# Patient Record
Sex: Female | Born: 1935 | Race: White | Hispanic: No
Health system: Southern US, Community
[De-identification: ages and names within clinical notes are randomized; demographics above are authoritative.]

## PROBLEM LIST (undated history)

## (undated) DIAGNOSIS — I4821 Permanent atrial fibrillation: Secondary | ICD-10-CM

---

## 2015-09-05 ENCOUNTER — Encounter (HOSPITAL_COMMUNITY): Payer: Self-pay | Admitting: Physician Assistant

## 2015-09-05 ENCOUNTER — Inpatient Hospital Stay (HOSPITAL_COMMUNITY): Payer: Medicare Other

## 2015-09-05 ENCOUNTER — Inpatient Hospital Stay (HOSPITAL_COMMUNITY)
Admission: AD | Admit: 2015-09-05 | Discharge: 2015-10-03 | DRG: 870 | Disposition: E | Payer: Medicare Other | Source: Other Acute Inpatient Hospital | Attending: Pulmonary Disease | Admitting: Pulmonary Disease

## 2015-09-05 DIAGNOSIS — Z7901 Long term (current) use of anticoagulants: Secondary | ICD-10-CM | POA: Diagnosis not present

## 2015-09-05 DIAGNOSIS — F419 Anxiety disorder, unspecified: Secondary | ICD-10-CM | POA: Diagnosis not present

## 2015-09-05 DIAGNOSIS — N170 Acute kidney failure with tubular necrosis: Secondary | ICD-10-CM | POA: Diagnosis present

## 2015-09-05 DIAGNOSIS — T82594A Other mechanical complication of infusion catheter, initial encounter: Secondary | ICD-10-CM

## 2015-09-05 DIAGNOSIS — E871 Hypo-osmolality and hyponatremia: Secondary | ICD-10-CM | POA: Diagnosis not present

## 2015-09-05 DIAGNOSIS — J9602 Acute respiratory failure with hypercapnia: Secondary | ICD-10-CM | POA: Diagnosis present

## 2015-09-05 DIAGNOSIS — I429 Cardiomyopathy, unspecified: Secondary | ICD-10-CM | POA: Diagnosis present

## 2015-09-05 DIAGNOSIS — E872 Acidosis, unspecified: Secondary | ICD-10-CM | POA: Diagnosis present

## 2015-09-05 DIAGNOSIS — I5023 Acute on chronic systolic (congestive) heart failure: Secondary | ICD-10-CM | POA: Diagnosis present

## 2015-09-05 DIAGNOSIS — R6521 Severe sepsis with septic shock: Secondary | ICD-10-CM | POA: Diagnosis present

## 2015-09-05 DIAGNOSIS — A419 Sepsis, unspecified organism: Principal | ICD-10-CM | POA: Diagnosis present

## 2015-09-05 DIAGNOSIS — N179 Acute kidney failure, unspecified: Secondary | ICD-10-CM | POA: Diagnosis not present

## 2015-09-05 DIAGNOSIS — I4892 Unspecified atrial flutter: Secondary | ICD-10-CM | POA: Diagnosis not present

## 2015-09-05 DIAGNOSIS — I5043 Acute on chronic combined systolic (congestive) and diastolic (congestive) heart failure: Secondary | ICD-10-CM | POA: Diagnosis present

## 2015-09-05 DIAGNOSIS — J9601 Acute respiratory failure with hypoxia: Secondary | ICD-10-CM | POA: Diagnosis present

## 2015-09-05 DIAGNOSIS — Y95 Nosocomial condition: Secondary | ICD-10-CM | POA: Diagnosis not present

## 2015-09-05 DIAGNOSIS — Z9119 Patient's noncompliance with other medical treatment and regimen: Secondary | ICD-10-CM | POA: Diagnosis not present

## 2015-09-05 DIAGNOSIS — I2489 Other forms of acute ischemic heart disease: Secondary | ICD-10-CM | POA: Diagnosis present

## 2015-09-05 DIAGNOSIS — Z515 Encounter for palliative care: Secondary | ICD-10-CM | POA: Insufficient documentation

## 2015-09-05 DIAGNOSIS — Z87891 Personal history of nicotine dependence: Secondary | ICD-10-CM | POA: Diagnosis not present

## 2015-09-05 DIAGNOSIS — R001 Bradycardia, unspecified: Secondary | ICD-10-CM | POA: Diagnosis not present

## 2015-09-05 DIAGNOSIS — R0603 Acute respiratory distress: Secondary | ICD-10-CM

## 2015-09-05 DIAGNOSIS — I214 Non-ST elevation (NSTEMI) myocardial infarction: Secondary | ICD-10-CM | POA: Diagnosis not present

## 2015-09-05 DIAGNOSIS — J9690 Respiratory failure, unspecified, unspecified whether with hypoxia or hypercapnia: Secondary | ICD-10-CM

## 2015-09-05 DIAGNOSIS — I130 Hypertensive heart and chronic kidney disease with heart failure and stage 1 through stage 4 chronic kidney disease, or unspecified chronic kidney disease: Secondary | ICD-10-CM | POA: Diagnosis present

## 2015-09-05 DIAGNOSIS — I340 Nonrheumatic mitral (valve) insufficiency: Secondary | ICD-10-CM

## 2015-09-05 DIAGNOSIS — R570 Cardiogenic shock: Secondary | ICD-10-CM | POA: Diagnosis present

## 2015-09-05 DIAGNOSIS — I4891 Unspecified atrial fibrillation: Secondary | ICD-10-CM

## 2015-09-05 DIAGNOSIS — D649 Anemia, unspecified: Secondary | ICD-10-CM | POA: Diagnosis present

## 2015-09-05 DIAGNOSIS — J9600 Acute respiratory failure, unspecified whether with hypoxia or hypercapnia: Secondary | ICD-10-CM | POA: Diagnosis not present

## 2015-09-05 DIAGNOSIS — G9340 Encephalopathy, unspecified: Secondary | ICD-10-CM | POA: Diagnosis not present

## 2015-09-05 DIAGNOSIS — R000 Tachycardia, unspecified: Secondary | ICD-10-CM | POA: Diagnosis not present

## 2015-09-05 DIAGNOSIS — K7200 Acute and subacute hepatic failure without coma: Secondary | ICD-10-CM | POA: Diagnosis present

## 2015-09-05 DIAGNOSIS — I350 Nonrheumatic aortic (valve) stenosis: Secondary | ICD-10-CM | POA: Diagnosis present

## 2015-09-05 DIAGNOSIS — Z452 Encounter for adjustment and management of vascular access device: Secondary | ICD-10-CM

## 2015-09-05 DIAGNOSIS — I483 Typical atrial flutter: Secondary | ICD-10-CM | POA: Diagnosis not present

## 2015-09-05 DIAGNOSIS — Z66 Do not resuscitate: Secondary | ICD-10-CM | POA: Diagnosis not present

## 2015-09-05 DIAGNOSIS — E876 Hypokalemia: Secondary | ICD-10-CM | POA: Diagnosis not present

## 2015-09-05 DIAGNOSIS — R739 Hyperglycemia, unspecified: Secondary | ICD-10-CM | POA: Diagnosis not present

## 2015-09-05 DIAGNOSIS — Z01818 Encounter for other preprocedural examination: Secondary | ICD-10-CM

## 2015-09-05 DIAGNOSIS — R34 Anuria and oliguria: Secondary | ICD-10-CM | POA: Diagnosis not present

## 2015-09-05 DIAGNOSIS — R0600 Dyspnea, unspecified: Secondary | ICD-10-CM

## 2015-09-05 DIAGNOSIS — J189 Pneumonia, unspecified organism: Secondary | ICD-10-CM | POA: Diagnosis not present

## 2015-09-05 DIAGNOSIS — I248 Other forms of acute ischemic heart disease: Secondary | ICD-10-CM | POA: Diagnosis present

## 2015-09-05 DIAGNOSIS — Z9114 Patient's other noncompliance with medication regimen: Secondary | ICD-10-CM | POA: Diagnosis not present

## 2015-09-05 DIAGNOSIS — N185 Chronic kidney disease, stage 5: Secondary | ICD-10-CM | POA: Diagnosis present

## 2015-09-05 DIAGNOSIS — E8729 Other acidosis: Secondary | ICD-10-CM | POA: Diagnosis present

## 2015-09-05 DIAGNOSIS — Z882 Allergy status to sulfonamides status: Secondary | ICD-10-CM | POA: Diagnosis not present

## 2015-09-05 DIAGNOSIS — I482 Chronic atrial fibrillation: Secondary | ICD-10-CM | POA: Diagnosis present

## 2015-09-05 DIAGNOSIS — I10 Essential (primary) hypertension: Secondary | ICD-10-CM

## 2015-09-05 DIAGNOSIS — K759 Inflammatory liver disease, unspecified: Secondary | ICD-10-CM | POA: Diagnosis not present

## 2015-09-05 HISTORY — DX: Permanent atrial fibrillation: I48.21

## 2015-09-05 LAB — STREP PNEUMONIAE URINARY ANTIGEN: STREP PNEUMO URINARY ANTIGEN: NEGATIVE

## 2015-09-05 LAB — BLOOD GAS, ARTERIAL
ACID-BASE DEFICIT: 14.8 mmol/L — AB (ref 0.0–2.0)
Bicarbonate: 12.5 mEq/L — ABNORMAL LOW (ref 20.0–24.0)
Drawn by: 44898
FIO2: 0.6
LHR: 16 {breaths}/min
O2 SAT: 94.8 %
PCO2 ART: 38.6 mmHg (ref 35.0–45.0)
PEEP/CPAP: 10 cmH2O
PH ART: 7.136 — AB (ref 7.350–7.450)
Patient temperature: 98.6
TCO2: 13.6 mmol/L (ref 0–100)
VT: 420 mL
pO2, Arterial: 96.1 mmHg (ref 80.0–100.0)

## 2015-09-05 LAB — CBC WITH DIFFERENTIAL/PLATELET
BASOS ABS: 0 10*3/uL (ref 0.0–0.1)
Basophils Relative: 0 %
EOS ABS: 0 10*3/uL (ref 0.0–0.7)
EOS PCT: 0 %
HCT: 42.6 % (ref 36.0–46.0)
Hemoglobin: 13.3 g/dL (ref 12.0–15.0)
Lymphocytes Relative: 21 %
Lymphs Abs: 3.1 10*3/uL (ref 0.7–4.0)
MCH: 29.4 pg (ref 26.0–34.0)
MCHC: 31.2 g/dL (ref 30.0–36.0)
MCV: 94 fL (ref 78.0–100.0)
MONOS PCT: 9 %
Monocytes Absolute: 1.3 10*3/uL — ABNORMAL HIGH (ref 0.1–1.0)
NEUTROS PCT: 70 %
Neutro Abs: 10.2 10*3/uL — ABNORMAL HIGH (ref 1.7–7.7)
PLATELETS: 237 10*3/uL (ref 150–400)
RBC: 4.53 MIL/uL (ref 3.87–5.11)
RDW: 15 % (ref 11.5–15.5)
WBC: 14.6 10*3/uL — AB (ref 4.0–10.5)

## 2015-09-05 LAB — POCT I-STAT 3, ART BLOOD GAS (G3+)
Acid-base deficit: 12 mmol/L — ABNORMAL HIGH (ref 0.0–2.0)
Acid-base deficit: 13 mmol/L — ABNORMAL HIGH (ref 0.0–2.0)
Bicarbonate: 17.5 mEq/L — ABNORMAL LOW (ref 20.0–24.0)
Bicarbonate: 13.1 mEq/L — ABNORMAL LOW (ref 20.0–24.0)
O2 Saturation: 86 %
O2 Saturation: 92 %
PCO2 ART: 53.7 mmHg — AB (ref 35.0–45.0)
PCO2 ART: 29.3 mmHg — AB (ref 35.0–45.0)
PH ART: 7.117 — AB (ref 7.350–7.450)
PH ART: 7.252 — AB (ref 7.350–7.450)
PO2 ART: 66 mmHg — AB (ref 80.0–100.0)
PO2 ART: 66 mmHg — AB (ref 80.0–100.0)
Patient temperature: 97.4
Patient temperature: 96.1
TCO2: 19 mmol/L (ref 0–100)
TCO2: 14 mmol/L (ref 0–100)

## 2015-09-05 LAB — URINE MICROSCOPIC-ADD ON

## 2015-09-05 LAB — ECHOCARDIOGRAM COMPLETE: Weight: 2673.74 oz

## 2015-09-05 LAB — COMPREHENSIVE METABOLIC PANEL
ALBUMIN: 2.3 g/dL — AB (ref 3.5–5.0)
ALK PHOS: 61 U/L (ref 38–126)
ALT: 288 U/L — ABNORMAL HIGH (ref 14–54)
AST: 385 U/L — AB (ref 15–41)
Anion gap: 18 — ABNORMAL HIGH (ref 5–15)
BILIRUBIN TOTAL: 1.7 mg/dL — AB (ref 0.3–1.2)
BUN: 23 mg/dL — AB (ref 6–20)
CALCIUM: 7 mg/dL — AB (ref 8.9–10.3)
CO2: 16 mmol/L — ABNORMAL LOW (ref 22–32)
Chloride: 109 mmol/L (ref 101–111)
Creatinine, Ser: 1.66 mg/dL — ABNORMAL HIGH (ref 0.44–1.00)
GFR calc Af Amer: 31 mL/min — ABNORMAL LOW (ref >60)
GFR calc non Af Amer: 27 mL/min — ABNORMAL LOW (ref >60)
GLUCOSE: 290 mg/dL — AB (ref 65–99)
Potassium: 4.4 mmol/L (ref 3.5–5.1)
Sodium: 143 mmol/L (ref 135–145)
TOTAL PROTEIN: 5.3 g/dL — AB (ref 6.5–8.1)

## 2015-09-05 LAB — BRAIN NATRIURETIC PEPTIDE: B NATRIURETIC PEPTIDE 5: 328.9 pg/mL — AB (ref 0.0–100.0)

## 2015-09-05 LAB — GLUCOSE, CAPILLARY
GLUCOSE-CAPILLARY: 218 mg/dL — AB (ref 65–99)
GLUCOSE-CAPILLARY: 248 mg/dL — AB (ref 65–99)
Glucose-Capillary: 192 mg/dL — ABNORMAL HIGH (ref 65–99)
Glucose-Capillary: 319 mg/dL — ABNORMAL HIGH (ref 65–99)

## 2015-09-05 LAB — URINALYSIS, ROUTINE W REFLEX MICROSCOPIC
BILIRUBIN URINE: NEGATIVE
GLUCOSE, UA: 100 mg/dL — AB
Ketones, ur: NEGATIVE mg/dL
Leukocytes, UA: NEGATIVE
NITRITE: NEGATIVE
PH: 5 (ref 5.0–8.0)
Protein, ur: >300 mg/dL — AB
SPECIFIC GRAVITY, URINE: 1.018 (ref 1.005–1.030)

## 2015-09-05 LAB — I-STAT ARTERIAL BLOOD GAS, ED
Acid-base deficit: 9 mmol/L — ABNORMAL HIGH (ref 0.0–2.0)
BICARBONATE: 18.6 meq/L — AB (ref 20.0–24.0)
O2 Saturation: 100 %
PCO2 ART: 46.4 mmHg — AB (ref 35.0–45.0)
Patient temperature: 99.1
TCO2: 20 mmol/L (ref 0–100)
pH, Arterial: 7.213 — ABNORMAL LOW (ref 7.350–7.450)
pO2, Arterial: 252 mmHg — ABNORMAL HIGH (ref 80.0–100.0)

## 2015-09-05 LAB — APTT: APTT: 32 s (ref 24–37)

## 2015-09-05 LAB — PROCALCITONIN: PROCALCITONIN: 0.5 ng/mL

## 2015-09-05 LAB — TROPONIN I
TROPONIN I: 2.28 ng/mL — AB (ref <0.031)
Troponin I: 0.87 ng/mL (ref <0.031)
Troponin I: 3.1 ng/mL (ref <0.031)

## 2015-09-05 LAB — LACTIC ACID, PLASMA
LACTIC ACID, VENOUS: 7.1 mmol/L — AB (ref 0.5–2.0)
Lactic Acid, Venous: 7.7 mmol/L (ref 0.5–2.0)

## 2015-09-05 LAB — PROTIME-INR
INR: 1.73 — AB (ref 0.00–1.49)
PROTHROMBIN TIME: 20.3 s — AB (ref 11.6–15.2)

## 2015-09-05 LAB — PHOSPHORUS: Phosphorus: 8.4 mg/dL — ABNORMAL HIGH (ref 2.5–4.6)

## 2015-09-05 LAB — MRSA PCR SCREENING: MRSA by PCR: POSITIVE — AB

## 2015-09-05 LAB — HEPARIN LEVEL (UNFRACTIONATED): Heparin Unfractionated: 1.26 IU/mL — ABNORMAL HIGH (ref 0.30–0.70)

## 2015-09-05 LAB — MAGNESIUM: Magnesium: 2 mg/dL (ref 1.7–2.4)

## 2015-09-05 MED ORDER — IPRATROPIUM-ALBUTEROL 0.5-2.5 (3) MG/3ML IN SOLN
3.0000 mL | Freq: Four times a day (QID) | RESPIRATORY_TRACT | Status: DC
  Administered 2015-09-05 – 2015-09-10 (×15): 3 mL via RESPIRATORY_TRACT
  Filled 2015-09-05 (×18): qty 3

## 2015-09-05 MED ORDER — PIPERACILLIN-TAZOBACTAM 3.375 G IVPB
3.3750 g | Freq: Three times a day (TID) | INTRAVENOUS | Status: DC
  Administered 2015-09-05 (×3): 3.375 g via INTRAVENOUS
  Filled 2015-09-05 (×5): qty 50

## 2015-09-05 MED ORDER — SODIUM BICARBONATE 8.4 % IV SOLN
INTRAVENOUS | Status: DC
  Administered 2015-09-05: 18:00:00 via INTRAVENOUS
  Filled 2015-09-05 (×3): qty 150

## 2015-09-05 MED ORDER — SODIUM BICARBONATE 8.4 % IV SOLN
INTRAVENOUS | Status: AC
  Administered 2015-09-05: 50 meq
  Filled 2015-09-05: qty 100

## 2015-09-05 MED ORDER — AMIODARONE HCL IN DEXTROSE 360-4.14 MG/200ML-% IV SOLN
30.0000 mg/h | INTRAVENOUS | Status: DC
  Administered 2015-09-06: 30 mg/h via INTRAVENOUS
  Filled 2015-09-05 (×4): qty 200

## 2015-09-05 MED ORDER — HEPARIN SODIUM (PORCINE) 5000 UNIT/ML IJ SOLN
5000.0000 [IU] | Freq: Three times a day (TID) | INTRAMUSCULAR | Status: DC
  Administered 2015-09-05: 5000 [IU] via SUBCUTANEOUS
  Filled 2015-09-05 (×2): qty 1

## 2015-09-05 MED ORDER — AMIODARONE LOAD VIA INFUSION
150.0000 mg | Freq: Once | INTRAVENOUS | Status: AC
  Administered 2015-09-05: 150 mg via INTRAVENOUS
  Filled 2015-09-05: qty 83.34

## 2015-09-05 MED ORDER — VASOPRESSIN 20 UNIT/ML IV SOLN
0.0300 [IU]/min | INTRAVENOUS | Status: DC
  Administered 2015-09-05 – 2015-09-06 (×2): 0.03 [IU]/min via INTRAVENOUS
  Filled 2015-09-05 (×3): qty 2

## 2015-09-05 MED ORDER — VANCOMYCIN HCL IN DEXTROSE 1-5 GM/200ML-% IV SOLN: 1000.0000 mg | INTRAVENOUS | Status: DC

## 2015-09-05 MED ORDER — ANTISEPTIC ORAL RINSE SOLUTION (CORINZ)
7.0000 mL | Freq: Four times a day (QID) | OROMUCOSAL | Status: DC
  Administered 2015-09-05 – 2015-09-12 (×27): 7 mL via OROMUCOSAL

## 2015-09-05 MED ORDER — ROCURONIUM BROMIDE 50 MG/5ML IV SOLN
INTRAVENOUS | Status: AC | PRN
  Administered 2015-09-05: 100 mg via INTRAVENOUS

## 2015-09-05 MED ORDER — CHLORHEXIDINE GLUCONATE 0.12% ORAL RINSE (MEDLINE KIT)
15.0000 mL | Freq: Two times a day (BID) | OROMUCOSAL | Status: DC
  Administered 2015-09-05 – 2015-09-11 (×14): 15 mL via OROMUCOSAL

## 2015-09-05 MED ORDER — HEPARIN BOLUS VIA INFUSION
2000.0000 [IU] | Freq: Once | INTRAVENOUS | Status: AC
  Administered 2015-09-05: 2000 [IU] via INTRAVENOUS
  Filled 2015-09-05: qty 2000

## 2015-09-05 MED ORDER — VANCOMYCIN HCL IN DEXTROSE 750-5 MG/150ML-% IV SOLN
750.0000 mg | INTRAVENOUS | Status: DC
  Administered 2015-09-05: 750 mg via INTRAVENOUS
  Filled 2015-09-05: qty 150

## 2015-09-05 MED ORDER — SODIUM CHLORIDE 0.9 % IV SOLN: 250.0000 mL | INTRAVENOUS | Status: DC | PRN

## 2015-09-05 MED ORDER — HEPARIN (PORCINE) IN NACL 100-0.45 UNIT/ML-% IJ SOLN
1200.0000 [IU]/h | INTRAMUSCULAR | Status: DC
  Administered 2015-09-05 – 2015-09-06 (×2): 1200 [IU]/h via INTRAVENOUS
  Filled 2015-09-05 (×3): qty 250

## 2015-09-05 MED ORDER — DEXTROSE 5 % IV SOLN
0.5000 ug/min | INTRAVENOUS | Status: DC
  Administered 2015-09-05: 2 ug/min via INTRAVENOUS
  Administered 2015-09-06: 6 ug/min via INTRAVENOUS
  Filled 2015-09-05 (×3): qty 4

## 2015-09-05 MED ORDER — PANTOPRAZOLE SODIUM 40 MG IV SOLR
40.0000 mg | Freq: Every day | INTRAVENOUS | Status: DC
  Administered 2015-09-05 – 2015-09-08 (×4): 40 mg via INTRAVENOUS
  Filled 2015-09-05 (×4): qty 40

## 2015-09-05 MED ORDER — SODIUM BICARBONATE 8.4 % IV SOLN
50.0000 meq | Freq: Once | INTRAVENOUS | Status: AC
  Administered 2015-09-05: 50 meq via INTRAVENOUS

## 2015-09-05 MED ORDER — MUPIROCIN 2 % EX OINT
1.0000 "application " | TOPICAL_OINTMENT | Freq: Two times a day (BID) | CUTANEOUS | Status: AC
  Administered 2015-09-05 – 2015-09-09 (×10): 1 via NASAL
  Filled 2015-09-05: qty 22

## 2015-09-05 MED ORDER — AMIODARONE HCL IN DEXTROSE 360-4.14 MG/200ML-% IV SOLN
60.0000 mg/h | INTRAVENOUS | Status: DC
  Administered 2015-09-05 (×2): 60 mg/h via INTRAVENOUS
  Filled 2015-09-05: qty 200

## 2015-09-05 MED ORDER — EPINEPHRINE HCL 0.1 MG/ML IJ SOSY
PREFILLED_SYRINGE | INTRAMUSCULAR | Status: AC
  Administered 2015-09-05: 1 mg
  Filled 2015-09-05: qty 10

## 2015-09-05 MED ORDER — PHENYLEPHRINE HCL 10 MG/ML IJ SOLN
0.0000 ug/min | INTRAVENOUS | Status: DC
  Administered 2015-09-05: 300 ug/min via INTRAVENOUS
  Administered 2015-09-05: 20 ug/min via INTRAVENOUS
  Filled 2015-09-05 (×2): qty 1

## 2015-09-05 MED ORDER — PHENYLEPHRINE HCL 10 MG/ML IJ SOLN
0.0000 ug/min | INTRAVENOUS | Status: DC
  Filled 2015-09-05: qty 1

## 2015-09-05 MED ORDER — INSULIN ASPART 100 UNIT/ML ~~LOC~~ SOLN
0.0000 [IU] | SUBCUTANEOUS | Status: DC
  Administered 2015-09-05: 11 [IU] via SUBCUTANEOUS
  Administered 2015-09-05 (×2): 5 [IU] via SUBCUTANEOUS

## 2015-09-05 MED ORDER — PHENYLEPHRINE HCL 10 MG/ML IJ SOLN
0.0000 ug/min | INTRAVENOUS | Status: DC
  Administered 2015-09-05 (×3): 400 ug/min via INTRAVENOUS
  Administered 2015-09-05: 300 ug/min via INTRAVENOUS
  Administered 2015-09-05: 250 ug/min via INTRAVENOUS
  Administered 2015-09-06: 400 ug/min via INTRAVENOUS
  Administered 2015-09-06: 150 ug/min via INTRAVENOUS
  Administered 2015-09-06: 300 ug/min via INTRAVENOUS
  Administered 2015-09-06 (×3): 400 ug/min via INTRAVENOUS
  Administered 2015-09-06: 350 ug/min via INTRAVENOUS
  Administered 2015-09-06: 300 ug/min via INTRAVENOUS
  Administered 2015-09-07 (×2): 225 ug/min via INTRAVENOUS
  Administered 2015-09-07: 150 ug/min via INTRAVENOUS
  Filled 2015-09-05 (×50): qty 4

## 2015-09-05 MED ORDER — SODIUM CHLORIDE 0.9 % IV BOLUS (SEPSIS)
1000.0000 mL | Freq: Once | INTRAVENOUS | Status: AC
  Administered 2015-09-05: 1000 mL via INTRAVENOUS

## 2015-09-05 MED ORDER — NOREPINEPHRINE BITARTRATE 1 MG/ML IV SOLN
0.0000 ug/min | INTRAVENOUS | Status: DC
  Administered 2015-09-05: 6 ug/min via INTRAVENOUS
  Administered 2015-09-07: 8 ug/min via INTRAVENOUS
  Filled 2015-09-05 (×2): qty 16

## 2015-09-05 MED ORDER — CHLORHEXIDINE GLUCONATE CLOTH 2 % EX PADS
6.0000 | MEDICATED_PAD | Freq: Every day | CUTANEOUS | Status: AC
  Administered 2015-09-06 – 2015-09-09 (×5): 6 via TOPICAL

## 2015-09-05 MED ORDER — ACETAMINOPHEN 325 MG PO TABS: 650.0000 mg | ORAL_TABLET | ORAL | Status: DC | PRN

## 2015-09-05 MED ORDER — SODIUM BICARBONATE 8.4 % IV SOLN
INTRAVENOUS | Status: DC
  Administered 2015-09-05: 16:00:00 via INTRAVENOUS
  Filled 2015-09-05 (×2): qty 850

## 2015-09-05 NOTE — Procedures (Signed)
Central Venous Catheter Insertion Procedure Note Mikael Sprayancy Barlowe [id number] 11/06/1927  Procedure: Insertion of Central Venous Catheter Indications: Assessment of intravascular volume, Drug and/or fluid administration and Frequent blood sampling  Procedure Details Consent: Risks of procedure as well as the alternatives and risks of each were explained to the (patient/caregiver).  Consent for procedure obtained. Time Out: Verified patient identification, verified procedure, site/side was marked, verified correct patient position, special equipment/implants available, medications/allergies/relevent history reviewed, required imaging and test results available.  Performed Real time US was used to ID and cannulate the vessel  Maximum sterile technique was used including antiseptics, cap, gloves, gown, hand hygiene, mask and sheet. Skin prep: Chlorhexidine; local anesthetic administered A antimicrobial bonded/coated triple lumen catheter was placed in the right internal jugular vein using the Seldinger technique.  Evaluation Blood flow good Complications: No apparent complications Patient did tolerate procedure well. Chest X-ray ordered to verify placement.  CXR: pending.   This was after an attempted left IJ CVL. We were unable to advance guide wire. We therefore switched to right IJ CVL after a CXR to confirm no PTX  Simonne MartinetPeter E Rashanna Christiana ACNP-BC [facility] Pulmonary/Critical Care Pager # [phone] OR # [phone] if no answer  Shelby Mattocksete E Dandria Griego 10/01/2015, 12:12 PM

## 2015-09-05 NOTE — Progress Notes (Signed)
ART line placed, site: left radial.

## 2015-09-05 NOTE — ED Provider Notes (Signed)
[facility] [place] [facility]. Initially she was in ICU transfer. Per [facility], patient decompensated in route. She was mentating but started grabbing at BiPAP and became combative. She also became hypoxic. They attempted to intubate the patient but were unable to get into end tidal CO2. This occurred just prior to arrival. Patient was given etomidate 10 minutes prior to my evaluation. She is being bag valve masked with pulse ox in the mid 80's.  Patient was intubated without difficulty with a 75 ET tube 22 at the lip. Chest x-ray ordered as well as ABG.    INTUBATION Performed by: Shon BatonHORTON, COURTNEY F  Required items: required blood products, implants, devices, and special equipment available Patient identity confirmed: provided demographic data and hospital-assigned identification number Time out: Immediately prior to procedure a "time out" was called to verify the correct patient, procedure, equipment, support staff and site/side marked as required.  Indications: respiratory failure  Intubation method: Glidescope Laryngoscopy   Preoxygenation: BVM  Sedatives: Etomidate Paralytic: Rocuronium  Tube Size: 7.5 cuffed  Post-procedure assessment: chest rise and ETCO2 monitor Breath sounds: equal and absent over the epigastrium Tube secured with: ETT holder Chest x-ray interpreted by radiologist and me.  Chest x-ray findings: endotracheal tube in appropriate position  Patient tolerated the procedure well with no immediate complications.     Shon Batonourtney F Horton, MD 10/05/2015 [phone]

## 2015-09-05 NOTE — Progress Notes (Signed)
Adjusted to keep O2 sats above 90%.  Per NP.

## 2015-09-05 NOTE — Progress Notes (Addendum)
Pharmacy Antibiotic Note  Allison Ball is a 82 y.o. female admitted on 09/24/2015 with sepsis.  Pharmacy has been consulted for vancomycin and Zosyn dosing.  Patient had vancomycin 1g at 0000 on 09/16/2015, Zosyn 3.375g IV at 2330 on 4/2, and Azithromycin 0350 on 4/3.   Currently do not know allergy information due to intubated patient and no family available. No allergies listed [facility]. Per Care Everywhere, Sulfa allergy listed per [facility] EMR system- unspecified reaction.   SCr worsening: 1.29 >>1.66, est CrCl~ 29 mL/min  Plan: Vancomycin 750mg  IV every 24 hours.  Goal trough 15-20 mcg/mL. Zosyn 3.375g IV q8h (4 hour infusion).  Next vancomycin dose due on 4/4 at 0030 AM.  Monitor renal function, clinical status, and culture results.  Vancomycin levels as appropriate   Temp (24hrs), Avg:97.6 F (36.4 C), Min:97.6 F (36.4 C), Max:97.6 F (36.4 C)  No results for input(s): WBC, CREATININE, LATICACIDVEN, VANCOTROUGH, VANCOPEAK, VANCORANDOM, GENTTROUGH, GENTPEAK, GENTRANDOM, TOBRATROUGH, TOBRAPEAK, TOBRARND, AMIKACINPEAK, AMIKACINTROU, AMIKACIN in the last 168 hours.  CrCl cannot be calculated (Unknown ideal weight.).    Allergies not on file  Antimicrobials this admission: Zosyn 4/2 >>  Vancomycin 4/3 >>  Azithromycin 4/3 x1  Dose adjustments this admission:   Microbiology results: 4/3 BCx: 4/3 UCx:  4/3 Sputum:  4/3 MRSA PCR: positive  Thank you for allowing pharmacy to be a part of this patient's care.  Link SnufferJessica Iyauna Sing, PharmD, BCPS Clinical Pharmacist [phone]   09/17/2015 8:32 AM

## 2015-09-05 NOTE — Progress Notes (Signed)
ANTICOAGULATION CONSULT NOTE - Initial Consult  Pharmacy Consult for Heparin Indication: atrial fibrillation  Allergies  Allergen Reactions  . Sulfa Antibiotics     Obtained from [employer] EMR system      Patient Measurements: Weight: 167 lb 1.7 oz (75.8 kg)  Height: 5\' 5"  IBW: 57 kg Heparin Dosing Weight: 72.6 kg  Vital Signs: Temp: 96.1 F (35.6 C) (04/03 1212) Temp Source: Oral (04/03 1212) BP: 113/71 mmHg (04/03 1245) Pulse Rate: 124 (04/03 1245)  Labs:  Recent Labs  02-Dec-2015 0921  HGB 13.3  HCT 42.6  PLT 237  APTT 32  LABPROT 20.3*  INR 1.73*  CREATININE 1.66*  TROPONINI 0.87*    CrCl cannot be calculated (Unknown ideal weight.).   Medical History: No past medical history on file.  Medications:  Prescriptions prior to admission  Medication Sig Dispense Refill Last Dose  . apixaban (ELIQUIS) 5 MG TABS tablet Take 5 mg by mouth 2 (two) times daily.   unknown  . atenolol (TENORMIN) 50 MG tablet Take 50 mg by mouth daily.   unknown  . diltiazem (CARDIZEM CD) 240 MG 24 hr capsule Take 240 mg by mouth daily.   unknown  . furosemide (LASIX) 20 MG tablet Take 20 mg by mouth every other day.   unknown  . valsartan-hydrochlorothiazide (DIOVAN-HCT) 160-25 MG tablet Take 1 tablet by mouth daily.   unknown    Assessment: 82 year old female on Eliquis PTA for chronic atrial fibrillation [facility] int[facility]ated and in acute renal failure to switch to heparin drip. Patient has had some non-compliance due to GI issues with her medications.  Baseline aPTT is wnl (32). INR is 1.73. Last dose of Eliquis is unknown but at least since Saturday. Last dose of SQ heparin at 11:07  Goal of Therapy:  Heparin level 0.3-0.7 units/ml  Monitor platelets by anticoagulation protocol: Yes   Plan:  Heparin bolus of 2000 units x1, then heparin drip at 1150 units/hr.  APTT and heparin level in 8 hours.  Daily aPTT, heparin level  until correlating and CBC while on therapy  Link SnufferJessica Drelyn Pistilli, PharmD, BCPS Clinical Pharmacist [phone] 09/20/2015,1:32 PM

## 2015-09-05 NOTE — Progress Notes (Signed)
Initial Nutrition Assessment  DOCUMENTATION CODES:   Not applicable  INTERVENTION:    If unable to extubate patient within the next 24 hours, recommend initiate TF via OGT with Vital High Protein at 25 ml/h and Prostat 30 ml BID on day 1; on day 2,d/c Prostat and increase to goal rate of 50 ml/h (1200 ml per day) to provide 1200 kcals, 105 gm protein, 1003 ml free water daily.  NUTRITION DIAGNOSIS:   Inadequate oral intake related to inability to eat as evidenced by NPO status.  GOAL:   Patient will meet greater than or equal to 90% of their needs  MONITOR:   Vent status, Labs, Weight trends, TF tolerance, I & O's  REASON FOR ASSESSMENT:   Ventilator    ASSESSMENT:   82 year old female [facility] distress and sepsis. [facility]  Labs reviewed: phosphorus elevated at 8.4. Discussed patient in ICU rounds and with RN today.  Nutrition-Focused physical exam completed. Findings are no fat depletion, no muscle depletion, and moderate edema.   Patient is currently intubated on ventilator support MV: 6.8 L/min Temp (24hrs), Avg:97 F (36.1 C), Min:96.1 F (35.6 C), Max:97.6 F (36.4 C)  Propofol: none   Diet Order:  Diet NPO time specified  Skin:  Reviewed, no issues  Last BM:  Unknown  Height:   Ht Readings from Last 1 Encounters:  09/15/2015 5\' 5"  (1.651 m)   Weight:   Wt Readings from Last 1 Encounters:  09/21/2015 167 lb 1.7 oz (75.8 kg)    Ideal Body Weight:  56.8 kg  BMI:  Body mass index is 27.81 kg/(m^2).  Estimated Nutritional Needs:   Kcal:  1234  Protein:  90-100 gm  Fluid:  1.5 L  EDUCATION NEEDS:   No education needs identified at this time  Joaquin CourtsKimberly Harris, RD, LDN, CNSC Pager [phone] After Hours Pager [phone]

## 2015-09-05 NOTE — Progress Notes (Signed)
eLink Physician-Brief Progress Note Patient Name: Allison Ball DOB: 07/29/1927 MRN: [id number]   Date of Service  09/20/2015  HPI/Events of Note  AF-RVR metab acidosis  eICU Interventions  Start vaso gtt Keep neo @ 100, titrate downlevo Add bicarb gtt Rpt ABg @ 0700     Intervention Category Major Interventions: Acid-Base disturbance - evaluation and management;Shock - evaluation and management  ALVA,RAKESH V. 09/18/2015, 3:25 PM

## 2015-09-05 NOTE — Progress Notes (Signed)
Called to bedside by eMD for hypotension on receiving amiodarone bolus. Heart rate went down to the 60s and BP to the 20s. Patient got several pushes of epinephrine and eventually got started on epi drip.  ABG shows worsening acidosis with PCo2 ion 50s.  - Get several amps of bicarbonate. - Increase bicarbonate drip to 200 - Increase respiratory rate to 30 - Continue epi drip in addition to neo, vaso and levaphed. - Resume amio drip without bolus. - Discussed with cardiology. May need cardioversion in HR still not controlled.   Family was called and updated about the worsening status and increasingly grim prognosis. They want to continue full code for now at least until tomorrow when they're awaiting the rest of the family to arrive. They're aware that she may not survive the night.  Additional critical care time - 30 mins.  Chilton GreathousePraveen Janielle Mittelstadt MD [place] [facility] and Critical [facility] Pager [phone] If no answer or after 3pm call: [phone] 09/04/2015, 6:29 PM

## 2015-09-05 NOTE — Progress Notes (Signed)
EKG CRITICAL VALUE     12 lead EKG performed.  Critical value noted.Jamesetta SoPhyllis, RN notified.   Albie Arizpe L, CCT 09/10/2015 8:56 AM

## 2015-09-05 NOTE — Progress Notes (Signed)
Patient transported from the ED to room 2M05 without any apparent complications. RT will continue to monitor.

## 2015-09-05 NOTE — Progress Notes (Signed)
CRITICAL VALUE ALERT  Critical value received: lactic acid 7.1 and troponin.87  Date of notification:  t  Time of notification:  1115  Critical value read back:Yes.    Nurse who received alert: Acey Lavphyllis Haydin Calandra   MD notified (1st page):  Dr Christene Slatese Dios  Time of first page:  1120  MD notified (2nd page):  Time of second page:  Responding MD:  Dr Christene Slatese Dios at bedside  Time MD responded:  *1120

## 2015-09-05 NOTE — ED Notes (Signed)
[facility] to [facility] care for respiratory distress possible pulmonary edema Vs Sepsis pt [facility], BP started dropping on the 80/50's las BP by [facility] 60/36, SPO2 drop to low 90's on NRM, pt got 50 Amidate by [facility] before getting to ED, pt int[facility]ated down on the [facility] by Dr. Wilkie AyeHorton PT be transferred to ICU care.

## 2015-09-05 NOTE — Progress Notes (Signed)
  Echocardiogram 2D Echocardiogram has been performed.  Janalyn HarderWest, Mung Rinker R 10/01/2015, 2:00 PM

## 2015-09-05 NOTE — Consult Note (Signed)
Cardiologist:   Allison Ball Reason for Consult:  Afib Referring Physician:   [name] is an 82 y.o. female.  HPI:   A 82 year old female transferred from [facility]. She has history of chronic atrial fibrillation.  She was in respiratory distress at the emergency room and was intubated.  Her cardiologist is Dr. Verita Schneiders.  According to notes, she is noncompliant with medications.  EEG shows 2-1 atrial flutter. 121 bpm.  she had an echocardiogram today, which was technically difficult, and revealed an ejection fraction of 35-40% with diffuse hypokinesis. LV diastolic function could not be evaluated. She has moderate aortic stenosis, severe MR and severe left atrial dilation. Mild TR peak PA pressure 36 mmHg. Small posterior pericardial effusion.  The patient is intubated and not responding to questioning.     Past Medical History  Diagnosis Date  . Permanent atrial fibrillation (HCC)     No past surgical history on file.  No family history on file.  Social History:  has no tobacco, alcohol, and drug history on file.  Allergies:  Allergies  Allergen Reactions  . Sulfa Antibiotics     Obtained from [employer] EMR system      Medications:  Scheduled Meds: . antiseptic oral rinse  7 mL Mouth Rinse QID  . chlorhexidine gluconate (SAGE KIT)  15 mL Mouth Rinse BID  . Chlorhexidine Gluconate Cloth  6 each Topical Q0600  . insulin aspart  0-15 Units Subcutaneous 6 times per day  . ipratropium-albuterol  3 mL Nebulization QID  . mupirocin ointment  1 application Nasal BID  . pantoprazole (PROTONIX) IV  40 mg Intravenous QHS  . piperacillin-tazobactam (ZOSYN)  IV  3.375 g Intravenous Q8H  . [START ON 09/06/2015] vancomycin  750 mg Intravenous Q24H   Continuous Infusions: . heparin 1,200 Units/hr (09/15/2015 1409)  . norepinephrine (LEVOPHED) Adult infusion 6 mcg/min (09/28/2015 1400)  . phenylephrine (NEO-SYNEPHRINE) Adult infusion 100 mcg/min (09/17/2015 1400)  .  sodium  bicarbonate 150 mEq in sterile water 1000 mL infusion    . vasopressin (PITRESSIN) infusion - *FOR SHOCK* 0.03 Units/min (09/30/2015 1523)   PRN Meds:.sodium chloride, acetaminophen   Results for orders placed or performed during the hospital encounter of 09/21/2015 (from the past 48 hour(s))  I-Stat arterial blood gas, ED     Status: Abnormal   Collection Time: 09/22/2015  6:51 AM  Result Value Ref Range   pH, Arterial 7.213 (L) 7.350 - 7.450   pCO2 arterial 46.4 (H) 35.0 - 45.0 mmHg   pO2, Arterial 252.0 (H) 80.0 - 100.0 mmHg   Bicarbonate 18.6 (L) 20.0 - 24.0 mEq/L   TCO2 20 0 - 100 mmol/L   O2 Saturation 100.0 %   Acid-base deficit 9.0 (H) 0.0 - 2.0 mmol/L   Patient temperature 99.1 F    Collection site RADIAL, ALLEN'S TEST ACCEPTABLE    Drawn by RT    Sample type ARTERIAL   Glucose, capillary     Status: Abnormal   Collection Time: 09/06/2015  7:48 AM  Result Value Ref Range   Glucose-Capillary 192 (H) 65 - 99 mg/dL  MRSA PCR Screening     Status: Abnormal   Collection Time: 09/08/2015  7:49 AM  Result Value Ref Range   MRSA by PCR POSITIVE (A) NEGATIVE    Comment:        The GeneXpert MRSA Assay (FDA approved for NASAL specimens only), is one component of a comprehensive MRSA colonization surveillance program. It is not intended to  diagnose MRSA infection nor to guide or monitor treatment for MRSA infections. RESULT CALLED TO, READ BACK BY AND VERIFIED WITH: Jacolyn Reedy RN 9:30 09/24/2015 ([email or web address])   Comprehensive metabolic panel     Status: Abnormal   Collection Time: 09/10/2015  9:21 AM  Result Value Ref Range   Sodium 143 135 - 145 mmol/L   Potassium 4.4 3.5 - 5.1 mmol/L   Chloride 109 101 - 111 mmol/L   CO2 16 (L) 22 - 32 mmol/L   Glucose, Bld 290 (H) 65 - 99 mg/dL   BUN 23 (H) 6 - 20 mg/dL   Creatinine, Ser 1.66 (H) 0.44 - 1.00 mg/dL   Calcium 7.0 (L) 8.9 - 10.3 mg/dL   Total Protein 5.3 (L) 6.5 - 8.1 g/dL   Albumin 2.3 (L) 3.5 - 5.0 g/dL   AST 385 (H) 15 - 41 U/L    ALT 288 (H) 14 - 54 U/L   Alkaline Phosphatase 61 38 - 126 U/L   Total Bilirubin 1.7 (H) 0.3 - 1.2 mg/dL   GFR calc non Af Amer 27 (L) >60 mL/min   GFR calc Af Amer 31 (L) >60 mL/min    Comment: (NOTE) The eGFR has been calculated using the CKD EPI equation. This calculation has not been validated in all clinical situations. eGFR's persistently <60 mL/min signify possible Chronic Kidney Disease.    Anion gap 18 (H) 5 - 15  Magnesium     Status: None   Collection Time: 09/30/2015  9:21 AM  Result Value Ref Range   Magnesium 2.0 1.7 - 2.4 mg/dL  Phosphorus     Status: Abnormal   Collection Time: 09/06/2015  9:21 AM  Result Value Ref Range   Phosphorus 8.4 (H) 2.5 - 4.6 mg/dL  Troponin I     Status: Abnormal   Collection Time: 09/29/2015  9:21 AM  Result Value Ref Range   Troponin I 0.87 (HH) <0.031 ng/mL    Comment:        POSSIBLE MYOCARDIAL ISCHEMIA. SERIAL TESTING RECOMMENDED. CRITICAL RESULT CALLED TO, READ BACK BY AND VERIFIED WITH: MCKINNEY,P RN @ 2297 09/18/2015 LEONARD,A   Lactic acid, plasma     Status: Abnormal   Collection Time: 09/26/2015  9:21 AM  Result Value Ref Range   Lactic Acid, Venous 7.1 (HH) 0.5 - 2.0 mmol/L    Comment: CRITICAL RESULT CALLED TO, READ BACK BY AND VERIFIED WITH: MCKINNEY,P RN @ [phone] 09/14/2015 LEONARD,A   CBC WITH DIFFERENTIAL     Status: Abnormal   Collection Time: 10/02/2015  9:21 AM  Result Value Ref Range   WBC 14.6 (H) 4.0 - 10.5 K/uL   RBC 4.53 3.87 - 5.11 MIL/uL   Hemoglobin 13.3 12.0 - 15.0 g/dL   HCT 42.6 36.0 - 46.0 %   MCV 94.0 78.0 - 100.0 fL   MCH 29.4 26.0 - 34.0 pg   MCHC 31.2 30.0 - 36.0 g/dL   RDW 15.0 11.5 - 15.5 %   Platelets 237 150 - 400 K/uL   Neutrophils Relative % 70 %   Lymphocytes Relative 21 %   Monocytes Relative 9 %   Eosinophils Relative 0 %   Basophils Relative 0 %   Neutro Abs 10.2 (H) 1.7 - 7.7 K/uL   Lymphs Abs 3.1 0.7 - 4.0 K/uL   Monocytes Absolute 1.3 (H) 0.1 - 1.0 K/uL   Eosinophils Absolute 0.0 0.0 -  0.7 K/uL   Basophils Absolute 0.0 0.0 - 0.1 K/uL   RBC  Morphology POLYCHROMASIA PRESENT    WBC Morphology MILD LEFT SHIFT (1-5% METAS, OCC MYELO, OCC BANDS)   Protime-INR     Status: Abnormal   Collection Time: 09/07/2015  9:21 AM  Result Value Ref Range   Prothrombin Time 20.3 (H) 11.6 - 15.2 seconds   INR 1.73 (H) 0.00 - 1.49  APTT     Status: None   Collection Time: 09/16/2015  9:21 AM  Result Value Ref Range   aPTT 32 24 - 37 seconds  Procalcitonin - Baseline     Status: None   Collection Time: 09/10/2015  9:21 AM  Result Value Ref Range   Procalcitonin 0.50 ng/mL    Comment:        Interpretation: PCT (Procalcitonin) <= 0.5 ng/mL: Systemic infection (sepsis) is not likely. Local bacterial infection is possible. (NOTE)         ICU PCT Algorithm               Non ICU PCT Algorithm    ----------------------------     ------------------------------         PCT < 0.25 ng/mL                 PCT < 0.1 ng/mL     Stopping of antibiotics            Stopping of antibiotics       strongly encouraged.               strongly encouraged.    ----------------------------     ------------------------------       PCT level decrease by               PCT < 0.25 ng/mL       >= 80% from peak PCT       OR PCT 0.25 - 0.5 ng/mL          Stopping of antibiotics                                             encouraged.     Stopping of antibiotics           encouraged.    ----------------------------     ------------------------------       PCT level decrease by              PCT >= 0.25 ng/mL       < 80% from peak PCT        AND PCT >= 0.5 ng/mL            Continuin g antibiotics                                              encouraged.       Continuing antibiotics            encouraged.    ----------------------------     ------------------------------     PCT level increase compared          PCT > 0.5 ng/mL         with peak PCT AND          PCT >= 0.5 ng/mL             Escalation  of antibiotics                                           strongly encouraged.      Escalation of antibiotics        strongly encouraged.   Glucose, capillary     Status: Abnormal   Collection Time: 09/09/2015 12:09 PM  Result Value Ref Range   Glucose-Capillary 218 (H) 65 - 99 mg/dL  Troponin I     Status: Abnormal   Collection Time: 09/27/2015  2:40 PM  Result Value Ref Range   Troponin I 2.28 (HH) <0.031 ng/mL    Comment:        POSSIBLE MYOCARDIAL ISCHEMIA. SERIAL TESTING RECOMMENDED. CRITICAL VALUE NOTED.  VALUE IS CONSISTENT WITH PREVIOUSLY REPORTED AND CALLED VALUE.   Urinalysis, Routine w reflex microscopic (not at [facility])     Status: Abnormal   Collection Time: 09/18/2015  2:41 PM  Result Value Ref Range   Color, Urine AMBER (A) YELLOW    Comment: BIOCHEMICALS MAY BE AFFECTED BY COLOR   APPearance CLOUDY (A) CLEAR   Specific Gravity, Urine 1.018 1.005 - 1.030   pH 5.0 5.0 - 8.0   Glucose, UA 100 (A) NEGATIVE mg/dL   Hgb urine dipstick LARGE (A) NEGATIVE   Bilirubin Urine NEGATIVE NEGATIVE   Ketones, ur NEGATIVE NEGATIVE mg/dL   Protein, ur >300 (A) NEGATIVE mg/dL   Nitrite NEGATIVE NEGATIVE   Leukocytes, UA NEGATIVE NEGATIVE  Urine microscopic-add on     Status: Abnormal   Collection Time: 09/16/2015  2:41 PM  Result Value Ref Range   Squamous Epithelial / LPF 0-5 (A) NONE SEEN   WBC, UA 0-5 0 - 5 WBC/hpf   RBC / HPF 6-30 0 - 5 RBC/hpf   Bacteria, UA MANY (A) NONE SEEN   Urine-Other LESS THAN 10 mL OF URINE SUBMITTED   Strep pneumoniae urinary antigen  (not at [facility])     Status: None   Collection Time: 09/10/2015  2:43 PM  Result Value Ref Range   Strep Pneumo Urinary Antigen NEGATIVE NEGATIVE    Comment:        Infection due to S. pneumoniae cannot be absolutely ruled out since the antigen present may be below the detection limit of the test.     Dg Chest Port 1 View  10/02/2015  CLINICAL DATA:  Central line placement. EXAM: PORTABLE CHEST 1 VIEW COMPARISON:  09/06/2015 at 0732 hours  FINDINGS: Endotracheal tube remains 1.5-2 cm above the carina. Right jugular central venous catheter has been placed and terminates over the lower SVC. Enteric tube has been placed and courses into the left upper abdomen with tip not imaged. Cardiomediastinal silhouette is unchanged. Thoracic aortic calcification is noted. Bilateral lung opacities have not significantly changed and are greatest in the perihilar regions. There is a small right pleural effusion. No pneumothorax is identified. IMPRESSION: 1. Support devices as above. 2. Unchanged airspace opacities likely reflecting pulmonary edema. Small right pleural effusion. Electronically Signed   By: Logan Bores M.D.   On: 09/03/2015 13:06   Dg Chest Port 1 View  09/03/2015  CLINICAL DATA:  Failed attempted central line placement on the left today. EXAM: PORTABLE CHEST 1 VIEW COMPARISON:  09/04/2015 FINDINGS: Endotracheal tube remains in stable position. Interval placement of NG tube which enters the stomach. No pneumothorax. No visible Central line. Hyperinflation of  the lungs compatible with COPD. Bilateral perihilar airspace opacities, likely edema, slightly worsened since prior study. Heart is borderline in size. Suspect small layering effusions. IMPRESSION: Mild to moderate CHF, slightly worsened. Suspect small layering effusions. No pneumothorax. Electronically Signed   By: Rolm Baptise M.D.   On: 09/13/2015 11:56   Dg Chest Port 1 View  09/20/2015  CLINICAL DATA:  Endotracheal tube placement.  Initial encounter. EXAM: PORTABLE CHEST 1 VIEW COMPARISON:  None. FINDINGS: The patient's endotracheal tube is seen ending 1-2 cm above the carina. This could be retracted 1-2 cm. Vascular congestion is noted. Patchy bilateral central airspace opacities likely reflect pulmonary edema. No pleural effusion or pneumothorax is seen. The cardiomediastinal silhouette is borderline enlarged. No acute osseous abnormalities are identified. IMPRESSION: 1. Endotracheal  tube seen ending 1-2 cm above the carina. This could be retracted 1-2 cm. 2. Vascular congestion and borderline cardiomegaly. Bilateral central airspace opacities likely reflect pulmonary edema. These results were called by telephone at the time of interpretation on 09/24/2015 at 6:57 am to Dr. Thayer Jew, who verbally acknowledged these results. Electronically Signed   By: Garald Balding M.D.   On: 09/15/2015 06:58    Review of Systems  Unable to perform ROS: acuity of condition   Blood pressure 92/69, pulse 124, temperature 97.4 F (36.3 C), temperature source Oral, resp. rate 16, weight 167 lb 1.7 oz (75.8 kg), SpO2 97 %. Physical Exam  Nursing note and vitals reviewed. Well nourished, well developed, intubated.  Not responding to questions or stimuli  HEENT: Pupils are equal round react to light accommodation.  Cardiac: Regular rhythm rate fast without murmurs rubs or gallops. Lungs:  clear to auscultation bilaterally, no wheezing, rhonchi or rales Abd: soft,  positive bowel sounds all quadrants Ext: 1+ lower extremity edema.  2+ radial and 1+ PT pulses.  Feet are very cool Skin:  dry Neuro:  Intubated.      Assessment/Plan: Active Problems:   Acute respiratory failure (HCC)   Respiratory acidosis   Metabolic acidosis   Dyspnea   Atrial fibrillation (HCC)   Atrial flutter (HCC)   HTN (hypertension)   Severe MR   Moderate AS   Cardiomyopathy  At this point we will add IV amiodarone in an effort to help control her HR.  On IV heparin.  Was on Eliquis at home.  Cardiomyopathy etiology? Sounds like it is new.  Severe MR is likely contributing to or the cause of   Pulmonary edema on CXR.   PCCM holding off on diuresis at the moment.  Troponin 2.28.  If she improves, will need ischemic evaluation.     Tarri Fuller, [facility] 09/26/2015, 3:40 PM   Patient seen and examined and history reviewed. Agree with above findings and plan. 82 yo WF transferred from [facility] with acute  respiratory failure. Unable to obtain any additional history other than what is in chart. Patient is unresponsive and no family present. She has a history of afib/flutter and has been on chronic Eliquis, atenolol, and diltiazem. Apparent long history of noncompliance. Presented with worsening SOB. Failed to respond to Bipap and intubated in ED. In atrial flutter with RVR rate 130. Hypotensive requiring pressors. EF 35-40% by Echo with moderate AS and severe MR. Precordium is surprisingly quiet. She does have LE edema. CXR c/w edema. ECG with atrial flutter and diffuse ST depression.   Will continue IV pressors and vent for support. Will initiate IV amiodarone to help with rate control. Not a candidate for  diltiazem or beta blockers with low BP. Will cycle cardiac enzymes. Not a candidate for invasive cardiac procedures at this point. IV heparin for anticoagulation.  Peter Martinique, [employer] 09/04/2015 4:00 PM

## 2015-09-05 NOTE — H&P (Signed)
PULMONARY / CRITICAL CARE MEDICINE   Name: Allison Ball MRN: [id number] DOB: [birth year]    ADMISSION DATE:  09-19-15 CONSULTATION DATE:  09-19-15  REFERRING MD:  ED  CHIEF COMPLAINT:  Resp distress.   HISTORY OF PRESENT ILLNESS:   Patient is encephalopathic and intubated so was unable to interview the patient. I spoke to patient's niece and nephew and chart review was done. I also spoke with patient's cardiologist, Dr. Marisa Sprinkles regarding pt's case.   Patient was transferred from [facility] for further care. She is known to have chronic atrial fibrillation, difficult to control because of her noncompliance to medicines. Per cardiologist, if she takes her meds, her A. fib is controlled. She is having GI issues preventing her from taking her medicines regularly. She was admitted back in fall of 2016 for rapid A. fib. Clinically improved. Last time she saw her cardiologist was a week ago. At that time, she was in rapid A. fib. Advised to take her medicines. She called her neighbor Saturday night. She was noted to be in respiratory distress. EMS was called and she was brought to [facility].  Patient was in respiratory distress at the ER. Blood pressure 114/73, pulse of 130, respiratory rate of 20. 85% on CPAP. Switched to BiPAP at 100% O2 saturation. Initial ABG 7.2, 44, 109 on the BiPAP. Her BNP was 318. Her white count was 11. Platelet count of 226. Creatinine of 1.29. Bicarbonate of 16. Lactic acid of 8.1. INR of 1.1. Opponent of 0.04. She was treated as an sepsis. Got Zosyn, vancomycin, and azithromycin. Transferred to [facility] ED. At the emergency room, she was in respiratory distress so she was intubated. Transferred to ICU.  Blood pressure was 60 systolic. She has received a liter of saline. Also is on Neo-Synephrine drip prior to getting a central line. Switched to levophed -- BP better. Little urine. O2 sats 85%,  60% FiO2.     PAST MEDICAL HISTORY :   She  has no past medical history on file.  Chronic atrial fibrillation for which she is noncompliant with meds. She is on Eliquis as well for this. Diastolic heart failure. Hypertension. Niece denies any recent history of infection. Limited information as patient is intubated. I think she has chronic kidney disease.  PAST SURGICAL HISTORY: She  has no past surgical history on file. No info available as pt is intubated.   Allergies  Allergen Reactions  . Sulfa Antibiotics     Obtained from [employer] EMR system      No current facility-administered medications on file prior to encounter.   No current outpatient prescriptions on file prior to encounter.    FAMILY HISTORY:  Her has no family status information on file.   No information available as patient is intubated. No family around at this point.  SOCIAL HISTORY: She   lives by herself. Independent in adls. Former smoker. Limited information as patient is intubated.  REVIEW OF SYSTEMS:   Patient is intubated, encephalopathic, unable to obtain.  SUBJECTIVE:  Sedated, comfortable.  VITAL SIGNS: BP 93/66 mmHg  Pulse 122  Temp(Src) 96.1 F (35.6 C) (Oral)  Resp 16  Wt 167 lb 1.7 oz (75.8 kg)  SpO2 95%  HEMODYNAMICS:    VENTILATOR SETTINGS: Vent Mode:  [-] PRVC FiO2 (%):  [60 %] 60 % Set Rate:  [16 bmp] 16 bmp Vt Set:  [420 mL] 420 mL PEEP:  [5 cmH20-10 cmH20] 10 cmH20 Plateau Pressure:  [17 cmH20-21 cmH20]  17 cmH20  INTAKE / OUTPUT:    PHYSICAL EXAMINATION: General:  PERLA. Prominent NVs. ET tube in place.  Neuro:  CN grossly intact. (-) lateralizing signs HEENT:  PERLA, (-) NVD. ET tube in place.  Cardiovascular:  Tachycardic. (-) s/m/r/g Lungs:  Fair ae. cracjkles mid-bases.  Abdomen:  (+) BS, soft, NT. Liver/spleen not palpated.  Musculoskeletal:  Cool extremites. Gr 2 edema.  Skin:  Warm, cool distally.   LABS:  BMET  Recent Labs Lab 09/28/2015 0921  NA 143  K 4.4  CL 109  CO2 16*  BUN  23*  CREATININE 1.66*  GLUCOSE 290*    Electrolytes  Recent Labs Lab 09/09/2015 0921  CALCIUM 7.0*  MG 2.0  PHOS 8.4*    CBC  Recent Labs Lab 09/16/2015 0921  WBC 14.6*  HGB 13.3  HCT 42.6  PLT 237    Coag's  Recent Labs Lab 09/09/2015 0921  APTT 32  INR 1.73*    Sepsis Markers  Recent Labs Lab 09/06/2015 0921  LATICACIDVEN 7.1*  PROCALCITON 0.50    ABG  Recent Labs Lab 09/09/2015 0651  PHART 7.213*  PCO2ART 46.4*  PO2ART 252.0*    Liver Enzymes  Recent Labs Lab 10/01/2015 0921  AST 385*  ALT 288*  ALKPHOS 61  BILITOT 1.7*  ALBUMIN 2.3*    Cardiac Enzymes  Recent Labs Lab 09/06/2015 0921  TROPONINI 0.87*    Glucose  Recent Labs Lab 10/02/2015 0748 09/30/2015 1209  GLUCAP 192* 218*    Imaging Dg Chest Port 1 View  09/11/2015  CLINICAL DATA:  Failed attempted central line placement on the left today. EXAM: PORTABLE CHEST 1 VIEW COMPARISON:  09/21/2015 FINDINGS: Endotracheal tube remains in stable position. Interval placement of NG tube which enters the stomach. No pneumothorax. No visible Central line. Hyperinflation of the lungs compatible with COPD. Bilateral perihilar airspace opacities, likely edema, slightly worsened since prior study. Heart is borderline in size. Suspect small layering effusions. IMPRESSION: Mild to moderate CHF, slightly worsened. Suspect small layering effusions. No pneumothorax. Electronically Signed   By: Charlett NoseKevin  Dover M.D.   On: 09/21/2015 11:56   Dg Chest Port 1 View  09/19/2015  CLINICAL DATA:  Endotracheal tube placement.  Initial encounter. EXAM: PORTABLE CHEST 1 VIEW COMPARISON:  None. FINDINGS: The patient's endotracheal tube is seen ending 1-2 cm above the carina. This could be retracted 1-2 cm. Vascular congestion is noted. Patchy bilateral central airspace opacities likely reflect pulmonary edema. No pleural effusion or pneumothorax is seen. The cardiomediastinal silhouette is borderline enlarged. No acute osseous  abnormalities are identified. IMPRESSION: 1. Endotracheal tube seen ending 1-2 cm above the carina. This could be retracted 1-2 cm. 2. Vascular congestion and borderline cardiomegaly. Bilateral central airspace opacities likely reflect pulmonary edema. These results were called by telephone at the time of interpretation on 09/18/2015 at 6:57 am to Dr. Ross MarcusOURTNEY HORTON, who verbally acknowledged these results. Electronically Signed   By: Roanna RaiderJeffery  Chang M.D.   On: 09/23/2015 06:58     STUDIES:  4/3  Echo >  CULTURES: MRSA 4/3 (+) Trache 4/3 > Blood 4/3 >   ANTIBIOTICS: Zosyn 4/3 > Vanc 4/3 >   SIGNIFICANT EVENTS: 4/3 admitted for resp failure. Intubated.   LINES/TUBES: R IJ 4/3 >   DISCUSSION: [age] female, admitted for dyspnea, intubated for hypoxemic respiratory failure. Hypotensive. Known to have chronic A. fib but noncompliant with medicines. Chest x-ray with pulmonary edema. Behaving like septic picture as well.  ASSESSMENT / PLAN:  PULMONARY A: Acute hypoxemic respiratory failure secondary to pulmonary edema, acute diastolic heart failure exacerbation, rapid atrial fibrillation, demand ischemia. Concern for healthcare associated pneumonia. P:   Continue on the ventilator for now. Not ready for wean. Broad-spectrum antibiotics pending culture. Panculture. De-escalate once with culture. Needs diuresis once blood pressure allows.    CARDIOVASCULAR A:  Chronic atrial fibrillation, not compliant with medications. CHF pEF exacerbation.  Pulmonary edema Not sure if she has atrial flutter per monitor P:  She was on Eliquis. We'll switch to heparin drip. Diuresis once blood pressure allows. Trend troponin.  Consult cards.   RENAL A:   Patient likely has CKD. AKI/CKD P:   S/P 1 L NS. Chest x-ray with pulmonary edema. We'll hold off on hydrating. Start pressors. May need to control HR. May need diuresis.   GASTROINTESTINAL A:   Transaminitis liekly related to  hypotension/CHF P:   Start TF Observe LFts  HEMATOLOGIC A:   On eliquis for afib anemia P:  Observe Hb/Hct  INFECTIOUS A:   Possible sepsis. Possible HCAP P:   Panculture. Vanc/zosyn pending cultures.  On levophed drip. May need vasopressin/neo if BP decreases further.   ENDOCRINE A:   No issues P:   Check FS q 4  NEUROLOGIC A:   No issues P:   RASS goal: 0- 01 Not requiring sedation for now. May need fentanyl/versed prn  I spent 35 minutes of critical care time with this patient today.   FAMILY  - Updates: I extensively discussed the case with Harvie Heck, nephew, and Aggie Cosier, niece. Per them, she was a DO NOT RESUSCITATE before. Harvie Heck once her full code now but will readdress in the next 24 hours.  - Inter-disciplinary family meet or Palliative Care meeting due by:  4/10/1   J. Alexis Frock, MD Pulmonary and Critical Care Medicine [facility] Pager: [phone] After 3 pm or if no response, call [phone]  2015-09-17, 12:46 PM

## 2015-09-05 NOTE — Progress Notes (Addendum)
BP 73/62 Dr Tyson AliasFeinstein paged NS bolus started per sepsis

## 2015-09-05 NOTE — Progress Notes (Signed)
eLink Physician-Brief Progress Note Patient Name: Allison Ball DOB: 05/28/1928 MRN: [id number]   Date of Service  09/06/2015  HPI/Events of Note  Received amio bolus >>> BP 20s HR dropped to 60s  eICU Interventions  Epi x 1 Bicarb x1 MD called to bedside     Intervention Category Major Interventions: Arrhythmia - evaluation and management;Shock - evaluation and management  Maryam Feely V. 09/27/2015, 5:10 PM

## 2015-09-05 NOTE — Progress Notes (Signed)
Received patient to [id number] and placed on monitor Heart rate 124 questionable aflutter skin diaphoretic and pt is unresponsive to pain pupils are equal and reactive.Admitting CCM physician paged

## 2015-09-05 NOTE — Progress Notes (Signed)
CCM [phone] pager called for hypotension

## 2015-09-06 ENCOUNTER — Inpatient Hospital Stay (HOSPITAL_COMMUNITY): Payer: Medicare Other

## 2015-09-06 DIAGNOSIS — I350 Nonrheumatic aortic (valve) stenosis: Secondary | ICD-10-CM

## 2015-09-06 DIAGNOSIS — I340 Nonrheumatic mitral (valve) insufficiency: Secondary | ICD-10-CM

## 2015-09-06 DIAGNOSIS — J9601 Acute respiratory failure with hypoxia: Secondary | ICD-10-CM

## 2015-09-06 DIAGNOSIS — I248 Other forms of acute ischemic heart disease: Secondary | ICD-10-CM | POA: Diagnosis present

## 2015-09-06 DIAGNOSIS — K759 Inflammatory liver disease, unspecified: Secondary | ICD-10-CM

## 2015-09-06 DIAGNOSIS — E872 Acidosis: Secondary | ICD-10-CM

## 2015-09-06 LAB — CBC
HEMATOCRIT: 37.6 % (ref 36.0–46.0)
HEMOGLOBIN: 12.4 g/dL (ref 12.0–15.0)
MCH: 29.2 pg (ref 26.0–34.0)
MCHC: 33 g/dL (ref 30.0–36.0)
MCV: 88.5 fL (ref 78.0–100.0)
Platelets: 257 10*3/uL (ref 150–400)
RBC: 4.25 MIL/uL (ref 3.87–5.11)
RDW: 14.4 % (ref 11.5–15.5)
WBC: 21.6 10*3/uL — AB (ref 4.0–10.5)

## 2015-09-06 LAB — HEPATIC FUNCTION PANEL
ALT: 3090 U/L — ABNORMAL HIGH (ref 14–54)
ALT: 2833 U/L — ABNORMAL HIGH (ref 14–54)
AST: 3993 U/L — AB (ref 15–41)
AST: 3775 U/L — AB (ref 15–41)
Albumin: 1.8 g/dL — ABNORMAL LOW (ref 3.5–5.0)
Albumin: 1.9 g/dL — ABNORMAL LOW (ref 3.5–5.0)
Alkaline Phosphatase: 58 U/L (ref 38–126)
Alkaline Phosphatase: 60 U/L (ref 38–126)
BILIRUBIN DIRECT: 0.5 mg/dL (ref 0.1–0.5)
BILIRUBIN DIRECT: 0.4 mg/dL (ref 0.1–0.5)
BILIRUBIN INDIRECT: 0.4 mg/dL (ref 0.3–0.9)
BILIRUBIN TOTAL: 0.9 mg/dL (ref 0.3–1.2)
Indirect Bilirubin: 0.7 mg/dL (ref 0.3–0.9)
TOTAL PROTEIN: 4.4 g/dL — AB (ref 6.5–8.1)
Total Bilirubin: 1.1 mg/dL (ref 0.3–1.2)
Total Protein: 4.5 g/dL — ABNORMAL LOW (ref 6.5–8.1)

## 2015-09-06 LAB — GLUCOSE, CAPILLARY
GLUCOSE-CAPILLARY: 104 mg/dL — AB (ref 65–99)
GLUCOSE-CAPILLARY: 117 mg/dL — AB (ref 65–99)
GLUCOSE-CAPILLARY: 120 mg/dL — AB (ref 65–99)
GLUCOSE-CAPILLARY: 120 mg/dL — AB (ref 65–99)
GLUCOSE-CAPILLARY: 124 mg/dL — AB (ref 65–99)
GLUCOSE-CAPILLARY: 230 mg/dL — AB (ref 65–99)
GLUCOSE-CAPILLARY: 510 mg/dL — AB (ref 65–99)
GLUCOSE-CAPILLARY: 539 mg/dL — AB (ref 65–99)
GLUCOSE-CAPILLARY: 95 mg/dL (ref 65–99)
GLUCOSE-CAPILLARY: 98 mg/dL (ref 65–99)
Glucose-Capillary: 116 mg/dL — ABNORMAL HIGH (ref 65–99)
Glucose-Capillary: 130 mg/dL — ABNORMAL HIGH (ref 65–99)
Glucose-Capillary: 133 mg/dL — ABNORMAL HIGH (ref 65–99)
Glucose-Capillary: 211 mg/dL — ABNORMAL HIGH (ref 65–99)
Glucose-Capillary: 272 mg/dL — ABNORMAL HIGH (ref 65–99)
Glucose-Capillary: 331 mg/dL — ABNORMAL HIGH (ref 65–99)
Glucose-Capillary: 423 mg/dL — ABNORMAL HIGH (ref 65–99)
Glucose-Capillary: 484 mg/dL — ABNORMAL HIGH (ref 65–99)
Glucose-Capillary: 489 mg/dL — ABNORMAL HIGH (ref 65–99)

## 2015-09-06 LAB — POCT I-STAT 3, ART BLOOD GAS (G3+)
Acid-base deficit: 1 mmol/L (ref 0.0–2.0)
BICARBONATE: 22.1 meq/L (ref 20.0–24.0)
O2 SAT: 98 %
PCO2 ART: 30.7 mmHg — AB (ref 35.0–45.0)
PO2 ART: 95 mmHg (ref 80.0–100.0)
Patient temperature: 98
TCO2: 23 mmol/L (ref 0–100)
pH, Arterial: 7.464 — ABNORMAL HIGH (ref 7.350–7.450)

## 2015-09-06 LAB — BASIC METABOLIC PANEL
ANION GAP: 21 — AB (ref 5–15)
Anion gap: 20 — ABNORMAL HIGH (ref 5–15)
BUN: 28 mg/dL — ABNORMAL HIGH (ref 6–20)
BUN: 27 mg/dL — AB (ref 6–20)
CALCIUM: 6.1 mg/dL — AB (ref 8.9–10.3)
CHLORIDE: 94 mmol/L — AB (ref 101–111)
CO2: 16 mmol/L — AB (ref 22–32)
CO2: 18 mmol/L — AB (ref 22–32)
CREATININE: 2.5 mg/dL — AB (ref 0.44–1.00)
Calcium: 6.3 mg/dL — CL (ref 8.9–10.3)
Chloride: 92 mmol/L — ABNORMAL LOW (ref 101–111)
Creatinine, Ser: 2.63 mg/dL — ABNORMAL HIGH (ref 0.44–1.00)
GFR calc Af Amer: 18 mL/min — ABNORMAL LOW (ref >60)
GFR calc non Af Amer: 16 mL/min — ABNORMAL LOW (ref >60)
GFR calc non Af Amer: 15 mL/min — ABNORMAL LOW (ref >60)
GFR, EST AFRICAN AMERICAN: 19 mL/min — AB (ref >60)
GLUCOSE: 301 mg/dL — AB (ref 65–99)
Glucose, Bld: 569 mg/dL (ref 65–99)
POTASSIUM: 3 mmol/L — AB (ref 3.5–5.1)
Potassium: 2.7 mmol/L — CL (ref 3.5–5.1)
SODIUM: 129 mmol/L — AB (ref 135–145)
Sodium: 132 mmol/L — ABNORMAL LOW (ref 135–145)

## 2015-09-06 LAB — POCT I-STAT, CHEM 8
BUN: 31 mg/dL — ABNORMAL HIGH (ref 6–20)
BUN: 28 mg/dL — ABNORMAL HIGH (ref 6–20)
CALCIUM ION: 0.82 mmol/L — AB (ref 1.13–1.30)
CHLORIDE: 90 mmol/L — AB (ref 101–111)
CHLORIDE: 91 mmol/L — AB (ref 101–111)
CREATININE: 2.5 mg/dL — AB (ref 0.44–1.00)
Calcium, Ion: 0.81 mmol/L — ABNORMAL LOW (ref 1.13–1.30)
Creatinine, Ser: 2.5 mg/dL — ABNORMAL HIGH (ref 0.44–1.00)
Glucose, Bld: 302 mg/dL — ABNORMAL HIGH (ref 65–99)
Glucose, Bld: 171 mg/dL — ABNORMAL HIGH (ref 65–99)
HEMATOCRIT: 41 % (ref 36.0–46.0)
HEMATOCRIT: 41 % (ref 36.0–46.0)
HEMOGLOBIN: 13.9 g/dL (ref 12.0–15.0)
Hemoglobin: 13.9 g/dL (ref 12.0–15.0)
POTASSIUM: 3 mmol/L — AB (ref 3.5–5.1)
POTASSIUM: 3 mmol/L — AB (ref 3.5–5.1)
SODIUM: 132 mmol/L — AB (ref 135–145)
Sodium: 133 mmol/L — ABNORMAL LOW (ref 135–145)
TCO2: 20 mmol/L (ref 0–100)
TCO2: 19 mmol/L (ref 0–100)

## 2015-09-06 LAB — LEGIONELLA PNEUMOPHILA SEROGP 1 UR AG: L. pneumophila Serogp 1 Ur Ag: NEGATIVE

## 2015-09-06 LAB — APTT
APTT: 64 s — AB (ref 24–37)
APTT: 128 s — AB (ref 24–37)
aPTT: 125 seconds — ABNORMAL HIGH (ref 24–37)
aPTT: 181 seconds — ABNORMAL HIGH (ref 24–37)

## 2015-09-06 LAB — ACETAMINOPHEN LEVEL

## 2015-09-06 LAB — LACTIC ACID, PLASMA: LACTIC ACID, VENOUS: 7.4 mmol/L — AB (ref 0.5–2.0)

## 2015-09-06 LAB — MAGNESIUM: MAGNESIUM: 1.3 mg/dL — AB (ref 1.7–2.4)

## 2015-09-06 LAB — URINE CULTURE
Culture: NO GROWTH
Special Requests: NORMAL

## 2015-09-06 LAB — POTASSIUM: POTASSIUM: 3.9 mmol/L (ref 3.5–5.1)

## 2015-09-06 LAB — SALICYLATE LEVEL

## 2015-09-06 LAB — RAPID URINE DRUG SCREEN, HOSP PERFORMED
Amphetamines: NOT DETECTED
BARBITURATES: NOT DETECTED
Benzodiazepines: NOT DETECTED
Cocaine: NOT DETECTED
OPIATES: POSITIVE — AB
TETRAHYDROCANNABINOL: NOT DETECTED

## 2015-09-06 LAB — PROCALCITONIN: Procalcitonin: 11.59 ng/mL

## 2015-09-06 LAB — ETHANOL

## 2015-09-06 LAB — PHOSPHORUS: PHOSPHORUS: 5 mg/dL — AB (ref 2.5–4.6)

## 2015-09-06 LAB — TROPONIN I: Troponin I: 4.18 ng/mL (ref <0.031)

## 2015-09-06 LAB — HEPARIN LEVEL (UNFRACTIONATED): HEPARIN UNFRACTIONATED: 1.36 [IU]/mL — AB (ref 0.30–0.70)

## 2015-09-06 MED ORDER — MAGNESIUM SULFATE 2 GM/50ML IV SOLN: 2.0000 g | Freq: Once | INTRAVENOUS | Status: DC

## 2015-09-06 MED ORDER — INSULIN ASPART 100 UNIT/ML ~~LOC~~ SOLN: 0.0000 [IU] | Freq: Three times a day (TID) | SUBCUTANEOUS | Status: DC

## 2015-09-06 MED ORDER — POTASSIUM CHLORIDE 20 MEQ/15ML (10%) PO SOLN
40.0000 meq | ORAL | Status: AC
  Administered 2015-09-06: 40 meq
  Filled 2015-09-06 (×2): qty 30

## 2015-09-06 MED ORDER — INSULIN ASPART 100 UNIT/ML ~~LOC~~ SOLN
20.0000 [IU] | Freq: Once | SUBCUTANEOUS | Status: AC
  Administered 2015-09-06: 20 [IU] via SUBCUTANEOUS

## 2015-09-06 MED ORDER — FENTANYL CITRATE (PF) 100 MCG/2ML IJ SOLN
50.0000 ug | INTRAMUSCULAR | Status: AC | PRN
  Administered 2015-09-06 – 2015-09-10 (×3): 50 ug via INTRAVENOUS
  Filled 2015-09-06 (×4): qty 2

## 2015-09-06 MED ORDER — INSULIN ASPART 100 UNIT/ML ~~LOC~~ SOLN
0.0000 [IU] | SUBCUTANEOUS | Status: DC
  Administered 2015-09-07 (×2): 2 [IU] via SUBCUTANEOUS
  Administered 2015-09-07: 3 [IU] via SUBCUTANEOUS
  Administered 2015-09-07: 2 [IU] via SUBCUTANEOUS
  Administered 2015-09-07 (×2): 3 [IU] via SUBCUTANEOUS
  Administered 2015-09-07: 2 [IU] via SUBCUTANEOUS
  Administered 2015-09-08 (×2): 3 [IU] via SUBCUTANEOUS

## 2015-09-06 MED ORDER — FUROSEMIDE 10 MG/ML IJ SOLN: 40.0000 mg | Freq: Once | INTRAMUSCULAR | Status: DC

## 2015-09-06 MED ORDER — SODIUM CHLORIDE 0.9 % IV SOLN
INTRAVENOUS | Status: DC
  Administered 2015-09-06: 4.5 [IU]/h via INTRAVENOUS
  Filled 2015-09-06: qty 2.5

## 2015-09-06 MED ORDER — VANCOMYCIN HCL IN DEXTROSE 750-5 MG/150ML-% IV SOLN
750.0000 mg | INTRAVENOUS | Status: DC
  Administered 2015-09-08: 750 mg via INTRAVENOUS
  Filled 2015-09-06: qty 150

## 2015-09-06 MED ORDER — VITAL HIGH PROTEIN PO LIQD
1000.0000 mL | ORAL | Status: DC
  Administered 2015-09-06 – 2015-09-07 (×2): 1000 mL
  Administered 2015-09-08: 02:00:00
  Administered 2015-09-08: 1000 mL

## 2015-09-06 MED ORDER — MAGNESIUM SULFATE 2 GM/50ML IV SOLN
2.0000 g | Freq: Once | INTRAVENOUS | Status: AC
  Administered 2015-09-06: 2 g via INTRAVENOUS
  Filled 2015-09-06: qty 50

## 2015-09-06 MED ORDER — PIPERACILLIN-TAZOBACTAM IN DEX 2-0.25 GM/50ML IV SOLN
2.2500 g | Freq: Three times a day (TID) | INTRAVENOUS | Status: DC
  Administered 2015-09-06 – 2015-09-09 (×10): 2.25 g via INTRAVENOUS
  Filled 2015-09-06 (×11): qty 50

## 2015-09-06 MED ORDER — HEPARIN (PORCINE) IN NACL 100-0.45 UNIT/ML-% IJ SOLN
1000.0000 [IU]/h | INTRAMUSCULAR | Status: DC
  Administered 2015-09-07: 650 [IU]/h via INTRAVENOUS
  Administered 2015-09-08: 850 [IU]/h via INTRAVENOUS
  Filled 2015-09-06 (×4): qty 250

## 2015-09-06 MED ORDER — POTASSIUM CHLORIDE 10 MEQ/50ML IV SOLN
10.0000 meq | INTRAVENOUS | Status: AC
  Administered 2015-09-06 (×3): 10 meq via INTRAVENOUS
  Filled 2015-09-06 (×3): qty 50

## 2015-09-06 MED ORDER — DOXYCYCLINE HYCLATE 100 MG IV SOLR
100.0000 mg | Freq: Two times a day (BID) | INTRAVENOUS | Status: DC
  Administered 2015-09-06 – 2015-09-08 (×6): 100 mg via INTRAVENOUS
  Filled 2015-09-06 (×8): qty 100

## 2015-09-06 MED ORDER — POTASSIUM CHLORIDE 10 MEQ/50ML IV SOLN
INTRAVENOUS | Status: AC
  Administered 2015-09-06: 10 meq
  Filled 2015-09-06: qty 50

## 2015-09-06 MED ORDER — POTASSIUM CHLORIDE 20 MEQ/15ML (10%) PO SOLN
40.0000 meq | ORAL | Status: AC
  Administered 2015-09-06: 40 meq

## 2015-09-06 MED ORDER — CALCIUM GLUCONATE 10 % IV SOLN
1.0000 g | Freq: Once | INTRAVENOUS | Status: AC
  Administered 2015-09-06: 1 g via INTRAVENOUS
  Filled 2015-09-06: qty 10

## 2015-09-06 MED ORDER — FUROSEMIDE 10 MG/ML IJ SOLN
160.0000 mg | Freq: Two times a day (BID) | INTRAVENOUS | Status: DC
  Administered 2015-09-06 – 2015-09-07 (×2): 160 mg via INTRAVENOUS
  Filled 2015-09-06 (×3): qty 16

## 2015-09-06 MED ORDER — INSULIN ASPART 100 UNIT/ML ~~LOC~~ SOLN: 0.0000 [IU] | SUBCUTANEOUS | Status: DC | PRN

## 2015-09-06 MED ORDER — STERILE WATER FOR INJECTION IV SOLN
INTRAVENOUS | Status: DC
  Administered 2015-09-06 (×2): via INTRAVENOUS
  Filled 2015-09-06 (×3): qty 9.7

## 2015-09-06 MED ORDER — FENTANYL CITRATE (PF) 100 MCG/2ML IJ SOLN
50.0000 ug | INTRAMUSCULAR | Status: DC | PRN
  Administered 2015-09-06 – 2015-09-10 (×21): 50 ug via INTRAVENOUS
  Filled 2015-09-06 (×20): qty 2

## 2015-09-06 MED ORDER — STERILE WATER FOR INJECTION IV SOLN
INTRAVENOUS | Status: DC
  Administered 2015-09-06: 13:00:00 via INTRAVENOUS
  Filled 2015-09-06 (×3): qty 850

## 2015-09-06 NOTE — Progress Notes (Signed)
ANTICOAGULATION CONSULT NOTE - Follow Up Consult  Pharmacy Consult for Heparin Indication: atrial fibrillation  Allergies  Allergen Reactions  . Sulfa Antibiotics     Obtained from [employer] EMR system      Patient Measurements: Height: 5\' 5"  (165.1 cm) Weight: 167 lb 8.8 oz (76 kg) IBW/kg (Calculated) : 57 Heparin Dosing Weight: 72.6 kg  Vital Signs: Temp: 99.4 F (37.4 C) (04/04 1943) Temp Source: Oral (04/04 1943) BP: 106/52 mmHg (04/04 1937) Pulse Rate: 122 (04/04 1937)  Labs:  Recent Labs  03-18-16 0921 03-18-16 1440 03-18-16 2308 03-18-16 2309 09/06/15 0406 09/06/15 0859 09/06/15 0908 09/06/15 0955 09/06/15 1206 09/06/15 1835  HGB 13.3  --   --   --  12.4 13.9  --   --  13.9  --   HCT 42.6  --   --   --  37.6 41.0  --   --  41.0  --   PLT 237  --   --   --  257  --   --   --   --   --   APTT 32  --  125*  --  181*  --   --  64*  --  128*  LABPROT 20.3*  --   --   --   --   --   --   --   --   --   INR 1.73*  --   --   --   --   --   --   --   --   --   HEPARINUNFRC  --   --  1.26*  --  1.36*  --   --   --   --   --   CREATININE 1.66*  --   --   --  2.50* 2.50* 2.63*  --  2.50*  --   TROPONINI 0.87* 2.28*  --  3.10*  --   --  4.18*  --   --   --     Estimated Creatinine Clearance: 16.5 mL/min (by C-G formula based on Cr of 2.5).   Assessment: 82 year old female in IV heparin (switched from Eliquis) for atrial fibrillation. APTT prolonged after bolus and rate of 1200 units/hr. Not clearing well with poor renal function. Heparin restarted earlier today at lower rate and aPTT is elevated again. No issues noted. Hgb stable.   Goal of Therapy:  Heparin level 0.3-0.7 units/ml aPTT 66-102 seconds Monitor platelets by anticoagulation protocol: Yes   Plan:  Decrease heparin gtt to 750 units/hr Check 8 hr aPTT Monitor daily aPTT / HL, CBC, s/s of bleed

## 2015-09-06 NOTE — Progress Notes (Signed)
Pt's CBG 510. Dr. Cleda DaubE. Hoffman made aware and new orders carried out.

## 2015-09-06 NOTE — Progress Notes (Addendum)
ANTICOAGULATION & ANTIBIOTIC CONSULT NOTE - Follow Up Consult  Pharmacy Consult for Heparin; Vancomycin and Zosyn Indication: atrial fibrillation; Empiric antibiotics for sepsis  Allergies  Allergen Reactions  . Sulfa Antibiotics     Obtained from [employer] EMR system      Patient Measurements: Height: 5\' 5"  (165.1 cm) Weight: 167 lb 8.8 oz (76 kg) IBW/kg (Calculated) : 57 Heparin Dosing Weight: 72.6 kg  Vital Signs: Temp: 98.4 F (36.9 C) (04/04 0804) Temp Source: Oral (04/04 0804) BP: 104/68 mmHg (04/04 1030) Pulse Rate: 121 (04/04 1030)  Labs:  Recent Labs  11/03/15 0921 11/03/15 1440 11/03/15 2308 11/03/15 2309 09/06/15 0406 09/06/15 0859 09/06/15 0908 09/06/15 0955  HGB 13.3  --   --   --  12.4 13.9  --   --   HCT 42.6  --   --   --  37.6 41.0  --   --   PLT 237  --   --   --  257  --   --   --   APTT 32  --  125*  --  181*  --   --  64*  LABPROT 20.3*  --   --   --   --   --   --   --   INR 1.73*  --   --   --   --   --   --   --   HEPARINUNFRC  --   --  1.26*  --  1.36*  --   --   --   CREATININE 1.66*  --   --   --  2.50* 2.50* 2.63*  --   TROPONINI 0.87* 2.28*  --  3.10*  --   --  4.18*  --     Estimated Creatinine Clearance: 15.7 mL/min (by C-G formula based on Cr of 2.63).   Assessment: 82 year old female in IV heparin (switched from Eliquis) for atrial fibrillation. APTT was prolonged after bolus and rate of 1200 units/hr. Not clearing well with poor renal function. Heparin was held x1hr and now APTT down to 64.Will resume at lower rate.   Infectious disease: [facility] >> continuing Vanc/Zosyn here. LA 8>>7, PCT up 11.59, Hypothermic, WBC elevated 21.6  Vanc 4/3 >> Zosyn 4/2 >> Azith 4/3 x1 dose Doxy 4/4 >>  4/3 BCx: 4/3 UCx:  4/3 Sputum:  4/3 MRSA PCR: positive 4/3 Strep pneumonia negative  Goal of Therapy:  Heparin level 0.3-0.7 units/ml aPTT 66-102 seconds Monitor platelets by  anticoagulation protocol: Yes  Vancomycin trough level 15- 20 mcg/mL   Plan:  Restart Heparin at 900 units/hr.  Recheck aPTT in 8 hours.  Daily aPTT, HL, and CBC - will continue to follow aPTT until HL is correlating.  Monitor CBC and signs or symptoms of bleeding.  Decrease vancomycin to 750 mg IV every 48 hours - will check VL prior to dose Decrease Zosyn to 2.25g IV every 8 hours.   Link SnufferJessica Brentin Shin, PharmD, BCPS Clinical Pharmacist [phone] 09/06/2015,10:41 AM

## 2015-09-06 NOTE — Consult Note (Signed)
Allison Ball is an 82 y.o. female referred by Dr Corrie Dandy   Chief Complaint: ARF  HPI:  82yo WF admitted from [facility] for resp distress and a fib.   Has been hypotensive requiring pressors and was on valsartan PTA.  No known hx of renal insuff but this is the first time she has been to this hospital. Scr 1.66 yest and today  2.5.  Minimal UO since admission.  Has been on valsartan.   Past Medical History  Diagnosis Date  . Permanent atrial fibrillation (HCC)     No past surgical history on file.  No family history on file. unable to obtain Social History:  has no tobacco, alcohol, and drug history on file. Reportedly lives by self   Allergies:  Allergies  Allergen Reactions  . Sulfa Antibiotics     Obtained from [employer] EMR system      Medications Prior to Admission  Medication Sig Dispense Refill  . apixaban (ELIQUIS) 5 MG TABS tablet Take 5 mg by mouth 2 (two) times daily.    Marland Kitchen atenolol (TENORMIN) 50 MG tablet Take 50 mg by mouth daily.    Marland Kitchen diltiazem (CARDIZEM CD) 240 MG 24 hr capsule Take 240 mg by mouth daily.    . furosemide (LASIX) 20 MG tablet Take 20 mg by mouth every other day.    . levothyroxine (SYNTHROID, LEVOTHROID) 50 MCG tablet Take 50 mcg by mouth daily before breakfast.    . valsartan-hydrochlorothiazide (DIOVAN-HCT) 160-25 MG tablet Take 1 tablet by mouth daily.       Lab Results: UA: >300 protein 6-30 rbc, 0-5 wbc   Recent Labs  10/01/2015 0921 09/06/15 0406 09/06/15 0859 09/06/15 1206  WBC 14.6* 21.6*  --   --   HGB 13.3 12.4 13.9 13.9  HCT 42.6 37.6 41.0 41.0  PLT 237 257  --   --    BMET  Recent Labs  09/28/2015 0921 09/06/15 0406 09/06/15 0859 09/06/15 0908 09/06/15 1206  NA 143 129* 132* 132* 133*  K 4.4 2.7* 3.0* 3.0* 3.0*  CL 109 92* 90* 94* 91*  CO2 16* 16*  --  18*  --   GLUCOSE 290* 569* 302* 301* 171*  BUN 23* 28* 31* 27* 28*  CREATININE 1.66* 2.50* 2.50* 2.63* 2.50*  CALCIUM 7.0* 6.1*  --  6.3*  --   PHOS  8.4* 5.0*  --   --   --    LFT  Recent Labs  09/06/15 1315  PROT 4.4*  ALBUMIN 1.9*  AST 3775*  ALT 2833*  ALKPHOS 60  BILITOT 1.1  BILIDIR 0.4  IBILI 0.7   Dg Chest Port 1 View  09/06/2015  CLINICAL DATA:  Respiratory distress. EXAM: PORTABLE CHEST 1 VIEW COMPARISON:  09/30/2015. FINDINGS: Endotracheal tube, NG tube, right IJ line stable position. Heart size stable. Worsening of diffuse dense bilateral pulmonary infiltrates. Bilateral pleural effusions noted on today's exam. No pneumothorax. IMPRESSION: 1. Lines and tubes in stable position. 2. Worsening of dense bilateral pulmonary infiltrates/ pulmonary edema. New onset bilateral pleural effusions. Electronically Signed   By: Marcello Moores  Register   On: 09/06/2015 07:23   Dg Chest Port 1 View  09/16/2015  CLINICAL DATA:  Central line placement. EXAM: PORTABLE CHEST 1 VIEW COMPARISON:  10/01/2015 at 0732 hours FINDINGS: Endotracheal tube remains 1.5-2 cm above the carina. Right jugular central venous catheter has been placed and terminates over the lower SVC. Enteric tube has been placed and courses into the left upper  abdomen with tip not imaged. Cardiomediastinal silhouette is unchanged. Thoracic aortic calcification is noted. Bilateral lung opacities have not significantly changed and are greatest in the perihilar regions. There is a small right pleural effusion. No pneumothorax is identified. IMPRESSION: 1. Support devices as above. 2. Unchanged airspace opacities likely reflecting pulmonary edema. Small right pleural effusion. Electronically Signed   By: Logan Bores M.D.   On: 09/09/2015 13:06   Dg Chest Port 1 View  09/03/2015  CLINICAL DATA:  Failed attempted central line placement on the left today. EXAM: PORTABLE CHEST 1 VIEW COMPARISON:  09/17/2015 FINDINGS: Endotracheal tube remains in stable position. Interval placement of NG tube which enters the stomach. No pneumothorax. No visible Central line. Hyperinflation of the lungs compatible  with COPD. Bilateral perihilar airspace opacities, likely edema, slightly worsened since prior study. Heart is borderline in size. Suspect small layering effusions. IMPRESSION: Mild to moderate CHF, slightly worsened. Suspect small layering effusions. No pneumothorax. Electronically Signed   By: Rolm Baptise M.D.   On: 09/19/2015 11:56   Dg Chest Port 1 View  09/24/2015  CLINICAL DATA:  Endotracheal tube placement.  Initial encounter. EXAM: PORTABLE CHEST 1 VIEW COMPARISON:  None. FINDINGS: The patient's endotracheal tube is seen ending 1-2 cm above the carina. This could be retracted 1-2 cm. Vascular congestion is noted. Patchy bilateral central airspace opacities likely reflect pulmonary edema. No pleural effusion or pneumothorax is seen. The cardiomediastinal silhouette is borderline enlarged. No acute osseous abnormalities are identified. IMPRESSION: 1. Endotracheal tube seen ending 1-2 cm above the carina. This could be retracted 1-2 cm. 2. Vascular congestion and borderline cardiomegaly. Bilateral central airspace opacities likely reflect pulmonary edema. These results were called by telephone at the time of interpretation on 09/27/2015 at 6:57 am to Dr. Thayer Jew, who verbally acknowledged these results. Electronically Signed   By: Garald Balding M.D.   On: 09/30/2015 06:58    ROS: Pt intubated and unable to give.   PHYSICAL EXAM: Blood pressure 111/74, pulse 124, temperature 98.4 F (36.9 C), temperature source Oral, resp. rate 30, height 5' 5"  (1.651 m), weight 76 kg (167 lb 8.8 oz), SpO2 100 %. HEENT: PERRLA EOMI  Pt awake and alert.  Intubated NECK:Rt triple lumen cath LUNGS:Bil crackles CARDIAC:Tachy, reg I cannot her any murmurs over the vent ABD:+ BS NTND No HSM EXT:0-tr edema NEURO:CNI Follows commands  No asterixis  Assessment: 1. Presumably ARF secondary to ischemic ATN in face of ARB.  UA has some rbc's but this was obtained after the foley was placed and she has been on  anticoagulants.  I see no other stigmata of vasculitis so will not send serology studies just yet as I suspect the rbc's are from trauma while on anticoagulants 2. Cardiomyopathy and valvular ds, mod As and severe MR 3. Chronic A fib though HR now regular but tachy 4. Hypokalemia being replaced 5. Met Acidosis on bicarb gtt 6. HypoMag sp replacement PLAN: 1. IV lasix to stimulate UO 2. Wean pressors as appropriate 3. Replace K as you are doing 4. Daily Scr  5. Cont IV bicarb 6. If renal fx cont to worsen then will need CVVHD   Opie Fanton T 09/06/2015, 2:31 PM

## 2015-09-06 NOTE — Progress Notes (Signed)
PCCM INTERVAL PROGRESS NOTE  Patient now waking up and nodding/shaking to yes no questions. Indicating that she is having pain.  Plan: PRN fentanyl per PAD protocol    Joneen RoachPaul Hoffman, AGACNP-BC [facility] Pager [phone] or [phone]  09/06/2015 12:04 AM

## 2015-09-06 NOTE — Progress Notes (Addendum)
ANTICOAGULATION CONSULT NOTE - Follow Up Consult  Pharmacy Consult for Heparin Indication: atrial fibrillation  Allergies  Allergen Reactions  . Sulfa Antibiotics     Obtained from [employer] EMR system      Patient Measurements: Height: 5\' 5"  (165.1 cm) Weight: 167 lb 8.8 oz (76 kg) IBW/kg (Calculated) : 57 Heparin Dosing Weight: 72.6 kg  Vital Signs: Temp: 98.4 F (36.9 C) (04/04 0804) Temp Source: Oral (04/04 0804) BP: 102/70 mmHg (04/04 0745) Pulse Rate: 119 (04/04 0745)  Labs:  Recent Labs  07-14-15 0921 07-14-15 1440 07-14-15 2308 07-14-15 2309 09/06/15 0406  HGB 13.3  --   --   --  12.4  HCT 42.6  --   --   --  37.6  PLT 237  --   --   --  257  APTT 32  --  125*  --  181*  LABPROT 20.3*  --   --   --   --   INR 1.73*  --   --   --   --   HEPARINUNFRC  --   --  1.26*  --  1.36*  CREATININE 1.66*  --   --   --  2.50*  TROPONINI 0.87* 2.28*  --  3.10*  --     Estimated Creatinine Clearance: 16.5 mL/min (by C-G formula based on Cr of 2.5).   Assessment: 82 year old female in IV heparin (switched from Eliquis) for atrial fibrillation. APTT prolonged after bolus and rate of 1200 units/hr. Not clearing well with poor renal function. Will hold heparin and recheck aPTT. Per RN no overt bleeding noted. CBC is stable.   Goal of Therapy:  Heparin level 0.3-0.7 units/ml aPTT 66-102 seconds Monitor platelets by anticoagulation protocol: Yes   Plan:  Hold heparin x1 hour. Recheck aPTT in 1 hour, when level <80 will resume at lower rate and recheck frequently.  Monitor CBC and signs or symptoms of bleeding.   Link SnufferJessica Kimberlea Schlag, PharmD, BCPS Clinical Pharmacist [phone] 09/06/2015,8:14 AM

## 2015-09-06 NOTE — Progress Notes (Signed)
CRITICAL VALUE ALERT  Critical value received:  Lactic acid 7.4 ,calcium 6.3 and troponin 4.18  Date of notification:  t  Time of notification:  0944 and 0949  Critical value read back:Yes.    Nurse who received alert:  Acey LavPhyllis Vedansh Kerstetter  MD notified (1st page):  Dr Tommi Rumpse dios  Time of first page:  1000  MD notified (2nd page):  Time of second page:  Responding MD:  Dr Tommi Rumpse dios  Time MD responded:  1000

## 2015-09-06 NOTE — Progress Notes (Signed)
TELEMETRY: Reviewed telemetry pt in atrial flutter with rate 122.: Filed Vitals:   09/06/15 0915 09/06/15 0930 09/06/15 0945 09/06/15 1000  BP: 115/77 113/74 88/63 98/69   Pulse: 120 123 124 121  Temp:      TempSrc:      Resp: 30 30 30 30   Height:      Weight:      SpO2: 100% 100% 100% 100%    Intake/Output Summary (Last 24 hours) at 09/06/15 1025 Last data filed at 09/06/15 1000  Gross per 24 hour  Intake 6053.1 ml  Output     44 ml  Net 6009.1 ml   Filed Weights   09/29/2015 0730 09/06/15 0500  Weight: 75.8 kg (167 lb 1.7 oz) 76 kg (167 lb 8.8 oz)    Subjective Patient intubated but awake. States breathing is better. No chest pain.  Marland Kitchen antiseptic oral rinse  7 mL Mouth Rinse QID  . chlorhexidine gluconate (SAGE KIT)  15 mL Mouth Rinse BID  . Chlorhexidine Gluconate Cloth  6 each Topical Q0600  . doxycycline (VIBRAMYCIN) IV  100 mg Intravenous Q12H  . ipratropium-albuterol  3 mL Nebulization QID  . mupirocin ointment  1 application Nasal BID  . pantoprazole (PROTONIX) IV  40 mg Intravenous QHS  . piperacillin-tazobactam (ZOSYN)  IV  2.25 g Intravenous Q8H  . [START ON 09/07/2015] vancomycin  750 mg Intravenous Q48H   . amiodarone 30 mg/hr (09/06/15 1000)  . epinephrine Stopped (09/06/15 0547)  . insulin (NOVOLIN-R) infusion 8.7 mL/hr at 09/06/15 0959  . norepinephrine (LEVOPHED) Adult infusion 8 mcg/min (09/06/15 1000)  . phenylephrine (NEO-SYNEPHRINE) Adult infusion 300 mcg/min (09/06/15 1000)  .  sodium bicarbonate infusion 1/4 NS 1000 mL 50 mL/hr at 09/06/15 0948  . vasopressin (PITRESSIN) infusion - *FOR SHOCK* 0.03 Units/min (09/06/15 1000)    LABS: Basic Metabolic Panel:  Recent Labs  09/09/2015 0921 09/06/15 0406 09/06/15 0859 09/06/15 0908  NA 143 129* 132* 132*  K 4.4 2.7* 3.0* 3.0*  CL 109 92* 90* 94*  CO2 16* 16*  --  18*  GLUCOSE 290* 569* 302* 301*  BUN 23* 28* 31* 27*  CREATININE 1.66* 2.50* 2.50* 2.63*  CALCIUM 7.0* 6.1*  --  6.3*  MG 2.0  1.3*  --   --   PHOS 8.4* 5.0*  --   --    Liver Function Tests:  Recent Labs  09/19/2015 0921 09/06/15 0406  AST 385* 3993*  ALT 288* 3090*  ALKPHOS 61 58  BILITOT 1.7* 0.9  PROT 5.3* 4.5*  ALBUMIN 2.3* 1.8*   No results for input(s): LIPASE, AMYLASE in the last 72 hours. CBC:  Recent Labs  09/28/2015 0921 09/06/15 0406 09/06/15 0859  WBC 14.6* 21.6*  --   NEUTROABS 10.2*  --   --   HGB 13.3 12.4 13.9  HCT 42.6 37.6 41.0  MCV 94.0 88.5  --   PLT 237 257  --    Cardiac Enzymes:  Recent Labs  09/19/2015 1440 09/25/2015 2309 09/06/15 0908  TROPONINI 2.28* 3.10* 4.18*   BNP: No results for input(s): PROBNP in the last 72 hours. D-Dimer: No results for input(s): DDIMER in the last 72 hours. Hemoglobin A1C: No results for input(s): HGBA1C in the last 72 hours. Fasting Lipid Panel: No results for input(s): CHOL, HDL, LDLCALC, TRIG, CHOLHDL, LDLDIRECT in the last 72 hours. Thyroid Function Tests: No results for input(s): TSH, T4TOTAL, T3FREE, THYROIDAB in the last 72 hours.  Invalid input(s): FREET3   Radiology/Studies:  Dg Chest Port 1 View  09/06/2015  CLINICAL DATA:  Respiratory distress. EXAM: PORTABLE CHEST 1 VIEW COMPARISON:  10/02/2015. FINDINGS: Endotracheal tube, NG tube, right IJ line stable position. Heart size stable. Worsening of diffuse dense bilateral pulmonary infiltrates. Bilateral pleural effusions noted on today's exam. No pneumothorax. IMPRESSION: 1. Lines and tubes in stable position. 2. Worsening of dense bilateral pulmonary infiltrates/ pulmonary edema. New onset bilateral pleural effusions. Electronically Signed   By: Marcello Moores  Register   On: 09/06/2015 07:23   Dg Chest Port 1 View  09/08/2015  CLINICAL DATA:  Central line placement. EXAM: PORTABLE CHEST 1 VIEW COMPARISON:  09/29/2015 at 0732 hours FINDINGS: Endotracheal tube remains 1.5-2 cm above the carina. Right jugular central venous catheter has been placed and terminates over the lower SVC.  Enteric tube has been placed and courses into the left upper abdomen with tip not imaged. Cardiomediastinal silhouette is unchanged. Thoracic aortic calcification is noted. Bilateral lung opacities have not significantly changed and are greatest in the perihilar regions. There is a small right pleural effusion. No pneumothorax is identified. IMPRESSION: 1. Support devices as above. 2. Unchanged airspace opacities likely reflecting pulmonary edema. Small right pleural effusion. Electronically Signed   By: Logan Bores M.D.   On: 09/17/2015 13:06   Dg Chest Port 1 View  09/06/2015  CLINICAL DATA:  Failed attempted central line placement on the left today. EXAM: PORTABLE CHEST 1 VIEW COMPARISON:  09/17/2015 FINDINGS: Endotracheal tube remains in stable position. Interval placement of NG tube which enters the stomach. No pneumothorax. No visible Central line. Hyperinflation of the lungs compatible with COPD. Bilateral perihilar airspace opacities, likely edema, slightly worsened since prior study. Heart is borderline in size. Suspect small layering effusions. IMPRESSION: Mild to moderate CHF, slightly worsened. Suspect small layering effusions. No pneumothorax. Electronically Signed   By: Rolm Baptise M.D.   On: 09/26/2015 11:56   Dg Chest Port 1 View  09/04/2015  CLINICAL DATA:  Endotracheal tube placement.  Initial encounter. EXAM: PORTABLE CHEST 1 VIEW COMPARISON:  None. FINDINGS: The patient's endotracheal tube is seen ending 1-2 cm above the carina. This could be retracted 1-2 cm. Vascular congestion is noted. Patchy bilateral central airspace opacities likely reflect pulmonary edema. No pleural effusion or pneumothorax is seen. The cardiomediastinal silhouette is borderline enlarged. No acute osseous abnormalities are identified. IMPRESSION: 1. Endotracheal tube seen ending 1-2 cm above the carina. This could be retracted 1-2 cm. 2. Vascular congestion and borderline cardiomegaly. Bilateral central airspace  opacities likely reflect pulmonary edema. These results were called by telephone at the time of interpretation on 09/11/2015 at 6:57 am to Dr. Thayer Jew, who verbally acknowledged these results. Electronically Signed   By: Garald Balding M.D.   On: 09/21/2015 06:58    PHYSICAL EXAM General: Elderly, intubated Head: Normocephalic, atraumatic, sclera non-icteric, oropharynx is clear Neck: Negative for carotid bruits. JVD  elevated. No adenopathy Lungs: Clear bilaterally to auscultation without wheezes, rales, or rhonchi. Breathing is unlabored. Heart: RRR, tachy,  S1 S2 with 2/6 systolic murmur LSB  Abdomen: Soft, non-tender, non-distended with normoactive bowel sounds. No hepatomegaly. No rebound/guarding. No obvious abdominal masses. Msk:  Strength and tone appears normal for age. Extremities: 1+edema.  Distal pedal pulses are 2+ and equal bilaterally. Neuro: Alert and oriented X 3. Moves all extremities spontaneously. Psych:  Responds to questions appropriately with a normal affect.  ASSESSMENT AND PLAN: 1. Atrial flutter with RVR. Patient became hypotensive after IV amiodarone bolus. Now on IV amiodarone  with improvement in rate. AFlutter is chronic so not likely to respond to DCCV especially with marked LA enlargement. On IV heparin. Not a candidate for beta blocker or diltiazem due to hypotension and shock.   2. Acute on chronic systolic CHF. EF  35-40%. Continue pressor support for now. Unable to diurese with low BP.   3. Acute renal failure  4. Shock liver   5. Acute respiratory failure with hypoxemia   6. Moderate Aortic stenosis  7. Severe MR.  8. Elevated troponin. Most likely due to demand ischemia in setting of shock, tachycardia, and hypotension. Not a candidate for invasive cardiac evaluation.  Present on Admission:  . Acute respiratory failure ([place]) . Respiratory acidosis . Metabolic acidosis . Dyspnea . Atrial flutter ([place])  Signed, Jillisa Harris [place],  [place] 09/06/2015 10:25 AM

## 2015-09-06 NOTE — Progress Notes (Signed)
PULMONARY / CRITICAL CARE MEDICINE   Name: Natiya Seelinger MRN: [id number] DOB: 02-20-1928    ADMISSION DATE:  09/03/2015 CONSULTATION DATE:  10/01/2015  REFERRING MD:  ED  CHIEF COMPLAINT:  Resp distress.   SUBJECTIVE:  Awake, mouthing words around tube, appears comfortable. Overnight- hypotension and bradycardia after getting bolus of amiodarone. Given bolus epi and placed on epi drip. Also hyperglycemia. Also elctrolyte abnormality- hypoca, hypok, hypo mag, also with persistent Anion Gap.   VITAL SIGNS: BP 97/63 mmHg  Pulse 124  Temp(Src) 98.4 F (36.9 C) (Oral)  Resp 30  Ht  (1.651 m)  Wt 167 lb 8.8 oz (76 kg)  BMI 27.88 kg/m2  SpO2 100%  HEMODYNAMICS: CVP:  [15 mmHg-18 mmHg] 15 mmHg  VENTILATOR SETTINGS: Vent Mode:  [-] PRVC FiO2 (%):  [50 %-60 %] 50 % Set Rate:  [30 bmp] 30 bmp Vt Set:  [420 mL] 420 mL PEEP:  [10 cmH20] 10 cmH20 Plateau Pressure:  [24 cmH20-29 cmH20] 29 cmH20  INTAKE / OUTPUT: I/O last 3 completed shifts: In: 6717.9 [I.V.:5405.4; IV Piggyback:1312.5] Out: 49 [Urine:49]  PHYSICAL EXAMINATION: General:  PERLA. ET tube in place, awake, following commands, RASS-0 Neuro:  CN grossly intact. HEENT:  Moist oral mucosa ET tube in place.  Cardiovascular:  Tachycardic. No murmurs. Lungs:  Equal breath sounds. Crackles mid-bases.  Abdomen:  Full, not tender Musculoskeletal:  Extremities now warm, no edema.. Skin:  Warm, no lesions  LABS:  BMET  Recent Labs Lab 09/25/2015 0921 09/06/15 0406 09/06/15 0859 09/06/15 0908 09/06/15 1206  NA 143 129* 132* 132* 133*  K 4.4 2.7* 3.0* 3.0* 3.0*  CL 109 92* 90* 94* 91*  CO2 16* 16*  --  18*  --   BUN 23* 28* 31* 27* 28*  CREATININE 1.66* 2.50* 2.50* 2.63* 2.50*  GLUCOSE 290* 569* 302* 301* 171*    Electrolytes  Recent Labs Lab 09/20/2015 0921 09/06/15 0406 09/06/15 0908  CALCIUM 7.0* 6.1* 6.3*  MG 2.0 1.3*  --   PHOS 8.4* 5.0*  --     CBC  Recent Labs Lab 09/26/2015 0921 09/06/15 0406  09/06/15 0859 09/06/15 1206  WBC 14.6* 21.6*  --   --   HGB 13.3 12.4 13.9 13.9  HCT 42.6 37.6 41.0 41.0  PLT 237 257  --   --     Coag's  Recent Labs Lab 10/02/2015 0921 09/10/2015 2308 09/06/15 0406 09/06/15 0955  APTT 32 125* 181* 64*  INR 1.73*  --   --   --     Sepsis Markers  Recent Labs Lab 10/02/2015 0921 09/04/2015 1603 09/06/15 0406 09/06/15 0909  LATICACIDVEN 7.1* 7.7*  --  7.4*  PROCALCITON 0.50  --  11.59  --     ABG  Recent Labs Lab 09/10/2015 1717 10/01/2015 2103 09/06/15 0855  PHART 7.117* 7.252* 7.464*  PCO2ART 53.7* 29.3* 30.7*  PO2ART 66.0* 66.0* 95.0    Liver Enzymes  Recent Labs Lab 10/02/2015 0921 09/06/15 0406  AST 385* 3993*  ALT 288* 3090*  ALKPHOS 61 58  BILITOT 1.7* 0.9  ALBUMIN 2.3* 1.8*    Cardiac Enzymes  Recent Labs Lab 09/11/2015 1440 09/24/2015 2309 09/06/15 0908  TROPONINI 2.28* 3.10* 4.18*    Glucose  Recent Labs Lab 09/15/2015 2002 09/06/15 0006 09/06/15 0008 09/06/15 0355 09/06/15 0544 09/06/15 0652  GLUCAP 319* 484* 539* 510* 489* 423*    Imaging Dg Chest Port 1 View  09/06/2015  CLINICAL DATA:  Respiratory distress. EXAM: PORTABLE CHEST  1 VIEW COMPARISON:  11/10/15. FINDINGS: Endotracheal tube, NG tube, right IJ line stable position. Heart size stable. Worsening of diffuse dense bilateral pulmonary infiltrates. Bilateral pleural effusions noted on today's exam. No pneumothorax. IMPRESSION: 1. Lines and tubes in stable position. 2. Worsening of dense bilateral pulmonary infiltrates/ pulmonary edema. New onset bilateral pleural effusions. Electronically Signed   By: Maisie Fushomas  Register   On: 09/06/2015 07:23   STUDIES:  4/3  Echo > EF- 35-40%, diffuse hypokinesis  CULTURES: MRSA 4/3 (+) Trache 4/3 > Blood 4/3 >   ANTIBIOTICS: Zosyn 4/3 > Vanc 4/3 >  Doxycy 4/4 >  SIGNIFICANT EVENTS: 4/3 admitted for resp failure. Intubated.  4/4 had hypotension post amiod bolus > had epi pushes and inc pressors.    LINES/TUBES: R IJ 4/3 >   DISCUSSION: [age] female, admitted for dyspnea, intubated for hypoxemic respiratory failure. Hypotensive. Known to have chronic A. fib but noncompliant with medicines. Chest x-ray with pulmonary edema. Behaving like septic picture as well- with elevated WBC, hypothermia yesterday that has improved, with persistent lactic acidosis. Also increased troponin.  ASSESSMENT / PLAN:  PULMONARY A: Acute hypoxemic respiratory failure secondary to pulmonary edema, new mild systolic heart failure likley due to severe acidosis, rapid atrial fibrillation, demand ischemia. Concern for healthcare associated/community acq pneumonia. Pulm edema, new effusions P:   Continue on the ventilator for now. Not ready for wean. Cont Broad-spectrum antibiotics pending culture. Start Doxy for atypical coverage, Azithro Contra-ind with prolonged Qtc Panculture- pending. De-escalate once with culture. Needs diuresis once blood pressure allows. Repeat ABG- improved gases and PH- now 7.4  CARDIOVASCULAR A:  Chronic atrial fibrillation, not compliant with medications. CHF pEF exacerbation.  Pulmonary edema atrial flutter Hypotension Prolonged Qtc Increased troponin P:  Was on eliquis, Now on heparin drip. Diuresis once blood pressure allows. Increased trop likely due to demand, also AKI, ?baseline Cr. Unlikely candidate for intervention with AKI. Appreciate cards recs, now on Amiodarone for atria fib Off Epi drip and vasopressin, still on Neosyn, and levop  RENAL A:   Patient likely has CKD. AKI/CKD Anion gap metabolic acidosis Lactic acidosis, persistent Anuric Hypokalemia- 2.7 P:    Chest x-ray with pulmonary edema. We'll hold off on hydrating.  Will need diuresis.   GASTROINTESTINAL A:   Transaminitis liekly related to hypotension/CHF P:   Start TF- trickle. Observe LFts  HEMATOLOGIC A:   On eliquis at home for afib Anemia Worsening leukoctosis- 21.6 P:   Observe Hb/Hct Heparin gtt  INFECTIOUS A:   Possible sepsis. Possible HCAP, Vs CAP P:   Panculture. Vanc/zosyn pending cultures.  Doxycy for atypical coverage. Prolonged Qtc.  ENDOCRINE A:   Hyperglycemia, was on IVF with dextrose. P:   SSI- Q4H. Now on Novolog drip.  NEUROLOGIC A:   No issues P:   RASS goal: 0- 01 Not requiring sedation for now. May need fentanyl/versed prn  Inocente SallesEjiro Emokpae, MD. PGY-3 IMTS [phone]   ATTENDING NOTE: I have personally reviewed patient's available data, including medical history, events of note, physical examination and test results as part of my evaluation. I have discussed with resident/NP Dr. Mariea ClontsEmokpae  and other care team providers such as pharmacist, RN and RRT & co-ordinated with consultants.  82 y.o. year old female, admitted for resp failure. Pt was intubated for resp distress. Was hypotensive post intubation. Started on pressors. With lactic acidosis, AKI, demand ischemia. Cards consulted for afib/demand ischemia. Pt with hypotension after amio bolus last night > received epi pushes. On pressors.  Oliguric.    On exam, awake, comfortable. 90/70. HR 120s. 20s. 100% on 60%. Afebrile.  8L (+). ETT in place. Comfortable. Prominent NV. Crackles mid-base. Good s1. (+) BS, soft. Gr 2 edema. Rest of PE per resident.    Labs reviewed. Pertinent labs include the following: Creat 2.5, HCO3 18. K 3.  LFT in 3000+. Troponin 4. Lactic acid 7. WBC 21. Cultures are pending.    Imaging reviewed personally. CXR with B effusion, worse, +/- infiltrates.    Impression/Plan:  Acute Hypoxemic Hypercapneic Respiratory Failure secondary to : CHF exacerbation, Pulm edema, demand  ischemia, possible HCAP. Shock related to : cardiogenic, septic.  Afib AKI/CKD Lactic acidosis Metabolic acidosis - better Shock liver  Plan: 1. Cont vent support for now. Not ready for wean.  2. Needs diuresis once electrolytes are corrected. May need CVVH -- Nephrology  consulted.  3. Cont vanc and zosyn pending cultures. Add doxy for atypicals.  4. Will d/c amiodarone 2/2 : no significant change in HR (still 120s), LFTs in thousands, had hypotension 5. Start trickle feeds.  6. Sedation prn. Avoid delirium 7. Try to wean off pressors.  8. Cont heparin drip. Pt was on eliquis as outpt.  9. Keep FS < 180 mg%.   Code status : Full Code.   I have extensively discussed with the patient's family Harvie Heck and Aggie Cosier) the goals of care and they are in agreement. They know pt has poor prognosis with MSOF but they want everything done. Likely will need placement if ever she gets better.     The patient is critically ill with multiple organ systems failure and requires high complexity decision making for assessment and support, frequent evaluation and titration of therapies, application of advanced monitoring technologies and extensive interpretation of multiple databases.   Critical Care Time devoted to patient care services described in this note independent of APP time is 35  minutes.   Pollie Meyer, MD 09/06/2015,   1:44 PM [place] Pager [phone] After 3 pm or if no answer, call [phone]

## 2015-09-06 NOTE — Progress Notes (Signed)
Nutrition Follow-up  DOCUMENTATION CODES:   Not applicable  INTERVENTION:    Initiate trickle TF via OGT with Vital High Protein at 20 ml/h to provide 480 kcals, 42 gm protein, 401 ml free water daily.  NUTRITION DIAGNOSIS:   Inadequate oral intake related to inability to eat as evidenced by NPO status.  Ongoing  GOAL:   Patient will meet greater than or equal to 90% of their needs  Unmet  MONITOR:   Vent status, Labs, Weight trends, TF tolerance, I & O's  ASSESSMENT:   82 year [facility] and sepsis. [facility]  Discussed patient with CCM physician and RN today. RD to order TF at trickle rate today.  Labs reviewed: sodium and potassium are low.  Patient is currently intubated on ventilator support MV: 12.5 L/min Temp (24hrs), Avg:97.4 F (36.3 C), Min:96.1 F (35.6 C), Max:98.4 F (36.9 C)   Diet Order:  Diet NPO time specified  Skin:  Reviewed, no issues  Last BM:  Unknown  Height:   Ht Readings from Last 1 Encounters:  July 19, 2015 5\' 5"  (1.651 m)    Weight:   Wt Readings from Last 1 Encounters:  09/06/15 167 lb 8.8 oz (76 kg)    Ideal Body Weight:  56.8 kg  BMI:  Body mass index is 27.88 kg/(m^2).  Estimated Nutritional Needs:   Kcal:  1312  Protein:  90-100 gm  Fluid:  1.5 L  EDUCATION NEEDS:   No education needs identified at this time   Joaquin CourtsKimberly Priyah Schmuck, RD, LDN, CNSC Pager [phone] After Hours Pager [phone]

## 2015-09-06 NOTE — Progress Notes (Signed)
Pt's CBG 539. Dr. Cleda DaubE. Hoffman NP made aware and new orders carried out.

## 2015-09-06 NOTE — Progress Notes (Signed)
CRITICAL VALUE ALERT  Critical value received:  K+ 2.7, Ca 6.1, glucose 569  Date of notification:  09/06/2015  Time of notification:  0434  Critical value read back:Yes.    Nurse who received alert:  Lillia CorporalHancock, Juhi Lagrange Elizabeth  MD notified (1st page):  Dr. Cleda DaubE. Hoffman  Time of first page:  [phone]  MD notified (2nd page):  Time of second page:  Responding MD:  Dr. Cleda DaubE. Hoffman  Time MD responded:  [phone]

## 2015-09-06 NOTE — H&P (Signed)
PULMONARY / CRITICAL CARE MEDICINE   Name: Allison Ball MRN: [id number] DOB: August 01, 1927    ADMISSION DATE:  Sep 19, 2015 CONSULTATION DATE:  September 19, 2015  REFERRING MD:  ED  CHIEF COMPLAINT:  Resp distress.   SUBJECTIVE:  Awake, mouthing words around tube, appears comfortable. Overnight- hypotension and bradycardia after getting bolus of amiodarone. Given bolus epi and placed on epi drip. Also hyperglycemia. Also elctrolyte abnormality- hypoca, hypok, hypo mag, also with persistent Anion Gap.   VITAL SIGNS: BP 99/73 mmHg  Pulse 120  Temp(Src) 97.5 F (36.4 C) (Oral)  Resp 30  Ht  (1.651 m)  Wt 167 lb 8.8 oz (76 kg)  BMI 27.88 kg/m2  SpO2 100%  HEMODYNAMICS: CVP:  [18 mmHg] 18 mmHg  VENTILATOR SETTINGS: Vent Mode:  [-] PRVC FiO2 (%):  [50 %-60 %] 50 % Set Rate:  [16 bmp-30 bmp] 30 bmp Vt Set:  [420 mL] 420 mL PEEP:  [10 cmH20] 10 cmH20 Plateau Pressure:  [17 cmH20-27 cmH20] 24 cmH20  INTAKE / OUTPUT: I/O last 3 completed shifts: In: 6717.9 [I.V.:5405.4; IV Piggyback:1312.5] Out: 49 [Urine:49]  PHYSICAL EXAMINATION: General:  PERLA. ET tube in place, awake, following commands, RASS-0 Neuro:  CN grossly intact. HEENT:  Moist oral mucosa ET tube in place.  Cardiovascular:  Tachycardic. No murmurs. Lungs:  Equal breath sounds. Crackles mid-bases.  Abdomen:  Full, not tender Musculoskeletal:  Extremities now warm, no edema.. Skin:  Warm, no lesions  LABS:  BMET  Recent Labs Lab 09/19/2015 0921 09/06/15 0406  NA 143 129*  K 4.4 2.7*  CL 109 92*  CO2 16* 16*  BUN 23* 28*  CREATININE 1.66* 2.50*  GLUCOSE 290* 569*    Electrolytes  Recent Labs Lab 09-19-15 0921 09/06/15 0406  CALCIUM 7.0* 6.1*  MG 2.0 1.3*  PHOS 8.4* 5.0*    CBC  Recent Labs Lab 09/19/15 0921 09/06/15 0406  WBC 14.6* 21.6*  HGB 13.3 12.4  HCT 42.6 37.6  PLT 237 257    Coag's  Recent Labs Lab 09-19-15 0921 September 19, 2015 2308 09/06/15 0406  APTT 32 125* 181*  INR 1.73*  --    --     Sepsis Markers  Recent Labs Lab 09/19/15 0921 09-19-2015 1603 09/06/15 0406  LATICACIDVEN 7.1* 7.7*  --   PROCALCITON 0.50  --  11.59    ABG  Recent Labs Lab September 19, 2015 1520 09/19/2015 1717 2015/09/19 2103  PHART 7.136* 7.117* 7.252*  PCO2ART 38.6 53.7* 29.3*  PO2ART 96.1 66.0* 66.0*    Liver Enzymes  Recent Labs Lab 09-19-2015 0921 09/06/15 0406  AST 385* 3993*  ALT 288* 3090*  ALKPHOS 61 58  BILITOT 1.7* 0.9  ALBUMIN 2.3* 1.8*    Cardiac Enzymes  Recent Labs Lab 2015-09-19 0921 09/19/15 1440 2015/09/19 2309  TROPONINI 0.87* 2.28* 3.10*    Glucose  Recent Labs Lab September 19, 2015 2002 09/06/15 0006 09/06/15 0008 09/06/15 0355 09/06/15 0544 09/06/15 0652  GLUCAP 319* 484* 539* 510* 489* 423*    Imaging Dg Chest Port 1 View  09/06/2015  CLINICAL DATA:  Respiratory distress. EXAM: PORTABLE CHEST 1 VIEW COMPARISON:  19-Sep-2015. FINDINGS: Endotracheal tube, NG tube, right IJ line stable position. Heart size stable. Worsening of diffuse dense bilateral pulmonary infiltrates. Bilateral pleural effusions noted on today's exam. No pneumothorax. IMPRESSION: 1. Lines and tubes in stable position. 2. Worsening of dense bilateral pulmonary infiltrates/ pulmonary edema. New onset bilateral pleural effusions. Electronically Signed   By: Maisie Fus  Register   On: 09/06/2015 07:23   Dg  Chest Port 1 View  09/22/2015  CLINICAL DATA:  Central line placement. EXAM: PORTABLE CHEST 1 VIEW COMPARISON:  09/14/2015 at 0732 hours FINDINGS: Endotracheal tube remains 1.5-2 cm above the carina. Right jugular central venous catheter has been placed and terminates over the lower SVC. Enteric tube has been placed and courses into the left upper abdomen with tip not imaged. Cardiomediastinal silhouette is unchanged. Thoracic aortic calcification is noted. Bilateral lung opacities have not significantly changed and are greatest in the perihilar regions. There is a small right pleural effusion. No  pneumothorax is identified. IMPRESSION: 1. Support devices as above. 2. Unchanged airspace opacities likely reflecting pulmonary edema. Small right pleural effusion. Electronically Signed   By: Sebastian Ache M.D.   On: 09/17/2015 13:06   Dg Chest Port 1 View  09/18/2015  CLINICAL DATA:  Failed attempted central line placement on the left today. EXAM: PORTABLE CHEST 1 VIEW COMPARISON:  10/01/2015 FINDINGS: Endotracheal tube remains in stable position. Interval placement of NG tube which enters the stomach. No pneumothorax. No visible Central line. Hyperinflation of the lungs compatible with COPD. Bilateral perihilar airspace opacities, likely edema, slightly worsened since prior study. Heart is borderline in size. Suspect small layering effusions. IMPRESSION: Mild to moderate CHF, slightly worsened. Suspect small layering effusions. No pneumothorax. Electronically Signed   By: Charlett Nose M.D.   On: 09/17/2015 11:56   STUDIES:  4/3  Echo > EF- 35-40%, diffuse hypokinesis  CULTURES: MRSA 4/3 (+) Trache 4/3 > Blood 4/3 >   ANTIBIOTICS: Zosyn 4/3 > Vanc 4/3 >  Doxycy 4/4 >  SIGNIFICANT EVENTS: 4/3 admitted for resp failure. Intubated.  4/4 had hypotension post amiod bolus > had epi pushes and inc pressors.   LINES/TUBES: R IJ 4/3 >   DISCUSSION: 82 female, admitted for dyspnea, intubated for hypoxemic respiratory failure. Hypotensive. Known to have chronic A. fib but noncompliant with medicines. Chest x-ray with pulmonary edema. Behaving like septic picture as well- with elevated WBC, hypothermia yesterday that has improved, with persistent lactic acidosis. Also increased troponin.  ASSESSMENT / PLAN:  PULMONARY A: Acute hypoxemic respiratory failure secondary to pulmonary edema, new mild systolic heart failure likley due to severe acidosis, rapid atrial fibrillation, demand ischemia. Concern for healthcare associated/community acq pneumonia. Pulm edema, new effusions P:   Continue on  the ventilator for now. Not ready for wean. Cont Broad-spectrum antibiotics pending culture. Start Doxy for atypical coverage, Azithro Contra-ind with prolonged Qtc Panculture- pending. De-escalate once with culture. Needs diuresis once blood pressure allows. Repeat ABG- improved gases and PH- now 7.4  CARDIOVASCULAR A:  Chronic atrial fibrillation, not compliant with medications. CHF pEF exacerbation.  Pulmonary edema atrial flutter Hypotension Prolonged Qtc Increased troponin P:  Was on eliquis, Now on heparin drip. Diuresis once blood pressure allows. Increased trop likely due to demand, also AKI, ?baseline Cr. Unlikely candidate for intervention with AKI. Appreciate cards recs, now on Amiodarone for atria fib Off Epi drip and vasopressin, still on Neosyn, and levop  RENAL A:   Patient likely has CKD. AKI/CKD Anion gap metabolic acidosis Lactic acidosis, persistent Anuric Hypokalemia- 2.7 P:    Chest x-ray with pulmonary edema. We'll hold off on hydrating.  Will need diuresis.   GASTROINTESTINAL A:   Transaminitis liekly related to hypotension/CHF P:   Start TF- trickle. Observe LFts  HEMATOLOGIC A:   On eliquis at home for afib Anemia Worsening leukoctosis- 21.6 P:  Observe Hb/Hct Heparin gtt  INFECTIOUS A:   Possible sepsis. Possible  HCAP, Vs CAP P:   Panculture. Vanc/zosyn pending cultures.  Doxycy for atypical coverage. Prolonged Qtc.  ENDOCRINE A:   Hyperglycemia, was on IVF with dextrose. P:   SSI- Q4H. Now on Novolog drip.  NEUROLOGIC A:   No issues P:   RASS goal: 0- 01 Not requiring sedation for now. May need fentanyl/versed prn  Inocente SallesEjiro Emokpae, MD. PGY-3 IMTS [phone]   ATTENDING NOTE: I have personally reviewed patient's available data, including medical history, events of note, physical examination and test results as part of my evaluation. I have discussed with resident/NP Dr. Mariea ClontsEmokpae  and other care team providers such as  pharmacist, RN and RRT & co-ordinated with consultants.  82 y.o. year old female, admitted for resp failure. Pt was intubated for resp distress. Was hypotensive post intubation. Started on pressors. With lactic acidosis, AKI, demand ischemia. Cards consulted for afib/demand ischemia. Pt with hypotension after amio bolus last night > received epi pushes. On pressors. Oliguric.    On exam, awake, comfortable. 90/70. HR 120s. 20s. 100% on 60%. Afebrile.  8L (+). ETT in place. Comfortable. Prominent NV. Crackles mid-base. Good s1. (+) BS, soft. Gr 2 edema. Rest of PE per resident.    Labs reviewed. Pertinent labs include the following: Creat 2.5, HCO3 18. K 3.  LFT in 3000+. Troponin 4. Lactic acid 7. WBC 21. Cultures are pending.    Imaging reviewed personally. CXR with B effusion, worse, +/- infiltrates.    Impression/Plan:  Acute Hypoxemic Hypercapneic Respiratory Failure secondary to : CHF exacerbation, Pulm edema, demand  ischemia, possible HCAP. Shock related to : cardiogenic, septic.  Afib AKI/CKD Lactic acidosis Metabolic acidosis - better Shock liver  Plan: 1. Cont vent support for now. Not ready for wean.  2. Needs diuresis once electrolytes are corrected. May need CVVH -- Nephrology consulted.  3. Cont vanc and zosyn pending cultures. Add doxy for atypicals.  4. Will d/c amiodarone 2/2 : no significant change in HR (still 120s), LFTs in thousands, had hypotension 5. Start trickle feeds.  6. Sedation prn. Avoid delirium 7. Try to wean off pressors.  8. Cont heparin drip. Pt was on eliquis as outpt.  9. Keep FS < 180 mg%.   Code status : Full Code.   I have extensively discussed with the patient's family Harvie Heck(Randy and Aggie Cosierheresa) the goals of care and they are in agreement. They know pt has poor prognosis with MSOF but they want everything done. Likely will need placement if ever she gets better.     The patient is critically ill with multiple organ systems failure and requires  high complexity decision making for assessment and support, frequent evaluation and titration of therapies, application of advanced monitoring technologies and extensive interpretation of multiple databases.   Critical Care Time devoted to patient care services described in this note independent of APP time is 35  minutes.   Pollie MeyerJ. Angelo A de Dios, MD 09/06/2015,   1:31 PM [place] [facility] Pager [phone] After 3 pm or if no answer, call [phone]

## 2015-09-07 ENCOUNTER — Inpatient Hospital Stay (HOSPITAL_COMMUNITY): Payer: Medicare Other

## 2015-09-07 DIAGNOSIS — I483 Typical atrial flutter: Secondary | ICD-10-CM

## 2015-09-07 DIAGNOSIS — I248 Other forms of acute ischemic heart disease: Secondary | ICD-10-CM

## 2015-09-07 DIAGNOSIS — N179 Acute kidney failure, unspecified: Secondary | ICD-10-CM

## 2015-09-07 DIAGNOSIS — N171 Acute kidney failure with acute cortical necrosis: Secondary | ICD-10-CM

## 2015-09-07 DIAGNOSIS — I350 Nonrheumatic aortic (valve) stenosis: Secondary | ICD-10-CM

## 2015-09-07 LAB — PROTEIN / CREATININE RATIO, URINE
CREATININE, URINE: 52.91 mg/dL
Protein Creatinine Ratio: 10.47 mg/mg{Cre} — ABNORMAL HIGH (ref 0.00–0.15)
Total Protein, Urine: 554 mg/dL

## 2015-09-07 LAB — GLUCOSE, CAPILLARY
GLUCOSE-CAPILLARY: 157 mg/dL — AB (ref 65–99)
GLUCOSE-CAPILLARY: 241 mg/dL — AB (ref 65–99)
GLUCOSE-CAPILLARY: 168 mg/dL — AB (ref 65–99)
Glucose-Capillary: 126 mg/dL — ABNORMAL HIGH (ref 65–99)
Glucose-Capillary: 155 mg/dL — ABNORMAL HIGH (ref 65–99)
Glucose-Capillary: 226 mg/dL — ABNORMAL HIGH (ref 65–99)
Glucose-Capillary: 238 mg/dL — ABNORMAL HIGH (ref 65–99)

## 2015-09-07 LAB — RENAL FUNCTION PANEL
ANION GAP: 23 — AB (ref 5–15)
Albumin: 1.9 g/dL — ABNORMAL LOW (ref 3.5–5.0)
BUN: 30 mg/dL — ABNORMAL HIGH (ref 6–20)
CALCIUM: 6.5 mg/dL — AB (ref 8.9–10.3)
CO2: 18 mmol/L — ABNORMAL LOW (ref 22–32)
CREATININE: 3.5 mg/dL — AB (ref 0.44–1.00)
Chloride: 86 mmol/L — ABNORMAL LOW (ref 101–111)
GFR, EST AFRICAN AMERICAN: 13 mL/min — AB (ref >60)
GFR, EST NON AFRICAN AMERICAN: 11 mL/min — AB (ref >60)
Glucose, Bld: 199 mg/dL — ABNORMAL HIGH (ref 65–99)
Phosphorus: 4.3 mg/dL (ref 2.5–4.6)
Potassium: 4.7 mmol/L (ref 3.5–5.1)
SODIUM: 127 mmol/L — AB (ref 135–145)

## 2015-09-07 LAB — CBC
HCT: 32.7 % — ABNORMAL LOW (ref 36.0–46.0)
Hemoglobin: 11.6 g/dL — ABNORMAL LOW (ref 12.0–15.0)
MCH: 29.7 pg (ref 26.0–34.0)
MCHC: 35.5 g/dL (ref 30.0–36.0)
MCV: 83.8 fL (ref 78.0–100.0)
PLATELETS: 225 10*3/uL (ref 150–400)
RBC: 3.9 MIL/uL (ref 3.87–5.11)
RDW: 14.6 % (ref 11.5–15.5)
WBC: 18.9 10*3/uL — ABNORMAL HIGH (ref 4.0–10.5)

## 2015-09-07 LAB — HEPATIC FUNCTION PANEL
ALBUMIN: 2.1 g/dL — AB (ref 3.5–5.0)
ALT: 2759 U/L — ABNORMAL HIGH (ref 14–54)
AST: 2087 U/L — ABNORMAL HIGH (ref 15–41)
Alkaline Phosphatase: 77 U/L (ref 38–126)
BILIRUBIN INDIRECT: 1.1 mg/dL — AB (ref 0.3–0.9)
BILIRUBIN TOTAL: 1.8 mg/dL — AB (ref 0.3–1.2)
Bilirubin, Direct: 0.7 mg/dL — ABNORMAL HIGH (ref 0.1–0.5)
TOTAL PROTEIN: 5.2 g/dL — AB (ref 6.5–8.1)

## 2015-09-07 LAB — HEPARIN LEVEL (UNFRACTIONATED)
HEPARIN UNFRACTIONATED: 0.84 [IU]/mL — AB (ref 0.30–0.70)
HEPARIN UNFRACTIONATED: 0.52 [IU]/mL (ref 0.30–0.70)

## 2015-09-07 LAB — MAGNESIUM: Magnesium: 1.7 mg/dL (ref 1.7–2.4)

## 2015-09-07 LAB — PROCALCITONIN: Procalcitonin: 23.24 ng/mL

## 2015-09-07 LAB — APTT
APTT: 112 s — AB (ref 24–37)
APTT: 60 s — AB (ref 24–37)

## 2015-09-07 MED ORDER — FUROSEMIDE 10 MG/ML IJ SOLN
160.0000 mg | Freq: Three times a day (TID) | INTRAVENOUS | Status: DC
  Administered 2015-09-07 – 2015-09-12 (×15): 160 mg via INTRAVENOUS
  Filled 2015-09-07 (×17): qty 16

## 2015-09-07 MED ORDER — SODIUM CHLORIDE 0.9 % IV SOLN
0.0000 ug/min | INTRAVENOUS | Status: DC
  Administered 2015-09-07: 8 ug/min via INTRAVENOUS
  Administered 2015-09-10: 10 ug/min via INTRAVENOUS
  Administered 2015-09-12: 6 ug/min via INTRAVENOUS
  Filled 2015-09-07 (×4): qty 16

## 2015-09-07 MED ORDER — CITRIC ACID-SODIUM CITRATE 334-500 MG/5ML PO SOLN
30.0000 mL | Freq: Three times a day (TID) | ORAL | Status: DC
  Administered 2015-09-07 – 2015-09-12 (×20): 30 mL via ORAL
  Filled 2015-09-07 (×21): qty 30

## 2015-09-07 MED ORDER — SODIUM BICARBONATE 650 MG PO TABS
650.0000 mg | ORAL_TABLET | Freq: Two times a day (BID) | ORAL | Status: DC
  Administered 2015-09-07 (×2): 650 mg via ORAL
  Filled 2015-09-07 (×3): qty 1

## 2015-09-07 MED ORDER — SODIUM CHLORIDE 0.9 % IV SOLN
0.0000 ug/min | INTRAVENOUS | Status: DC
  Administered 2015-09-07 (×2): 400 ug/min via INTRAVENOUS
  Administered 2015-09-07: 300 ug/min via INTRAVENOUS
  Administered 2015-09-07: 340 ug/min via INTRAVENOUS
  Administered 2015-09-07: 350 ug/min via INTRAVENOUS
  Administered 2015-09-07: 300 ug/min via INTRAVENOUS
  Administered 2015-09-07: 340 ug/min via INTRAVENOUS
  Administered 2015-09-08: 225 ug/min via INTRAVENOUS
  Administered 2015-09-08: 180 ug/min via INTRAVENOUS
  Administered 2015-09-08: 160 ug/min via INTRAVENOUS
  Administered 2015-09-08: 180 ug/min via INTRAVENOUS
  Administered 2015-09-08: 225 ug/min via INTRAVENOUS
  Administered 2015-09-08 – 2015-09-09 (×2): 180 ug/min via INTRAVENOUS
  Administered 2015-09-09: 200 ug/min via INTRAVENOUS
  Administered 2015-09-09: 160 ug/min via INTRAVENOUS
  Administered 2015-09-09 (×3): 200 ug/min via INTRAVENOUS
  Administered 2015-09-10: 300 ug/min via INTRAVENOUS
  Administered 2015-09-10: 150 ug/min via INTRAVENOUS
  Administered 2015-09-10: 200 ug/min via INTRAVENOUS
  Administered 2015-09-10: 150 ug/min via INTRAVENOUS
  Administered 2015-09-10: 250 ug/min via INTRAVENOUS
  Administered 2015-09-10: 200 ug/min via INTRAVENOUS
  Administered 2015-09-11: 75 ug/min via INTRAVENOUS
  Administered 2015-09-11: 125 ug/min via INTRAVENOUS
  Administered 2015-09-11: 150 ug/min via INTRAVENOUS
  Administered 2015-09-12 (×2): 100 ug/min via INTRAVENOUS
  Filled 2015-09-07 (×36): qty 4

## 2015-09-07 MED ORDER — SODIUM CHLORIDE 0.9 % IV SOLN
1.0000 g | Freq: Once | INTRAVENOUS | Status: AC
  Administered 2015-09-07: 1 g via INTRAVENOUS
  Filled 2015-09-07: qty 10

## 2015-09-07 MED FILL — Medication: Qty: 1 | Status: AC

## 2015-09-07 NOTE — Progress Notes (Addendum)
PULMONARY / CRITICAL CARE MEDICINE   Name: Allison Ball MRN: [id number] DOB: [birth year]    ADMISSION DATE:  2015-09-14 CONSULTATION DATE:  09/14/15  REFERRING MD:  ED  CHIEF COMPLAINT:  Resp distress.   Brief History- Pt transferred from [place], [place]. Initial presentation- dyspnea, EMS called, patient hypoxic, combative on bipap enroute and intubated in the ED.   SUBJECTIVE:  Awake, mouthing words around tube, appears comfortable. Overnight- hypotension and bradycardia after getting bolus of amiodarone. Given bolus epi and placed on epi drip. Also hyperglycemia. Also elctrolyte abnormality- hypoca, hypok, hypo mag, also with persistent Anion Gap.  VITAL SIGNS: BP 90/48 mmHg  Pulse 117  Temp(Src) 99 F (37.2 C) (Oral)  Resp 27  Ht  (1.651 m)  Wt 189 lb 9.5 oz (86 kg)  BMI 31.55 kg/m2  SpO2 100%  HEMODYNAMICS: CVP:  [13 mmHg-15 mmHg] 13 mmHg  VENTILATOR SETTINGS: Vent Mode:  [-] CPAP;PSV FiO2 (%):  [40 %] 40 % Set Rate:  [30 bmp] 30 bmp Vt Set:  [420 mL] 420 mL PEEP:  [8 cmH20-10 cmH20] 8 cmH20 Pressure Support:  [10 cmH20] 10 cmH20 Plateau Pressure:  [20 cmH20-28 cmH20] 28 cmH20  INTAKE / OUTPUT: I/O last 3 completed shifts: In: 8943.1 [I.V.:7333.1; NG/GT:350; IV Piggyback:1260] Out: 440 [Urine:40; Emesis/NG output:400]  PHYSICAL EXAMINATION: General:  PERLA. ET tube in place, awake, following commands, RASS-0 Neuro:  CN grossly intact. HEENT:  Moist oral mucosa ET tube in place.  Cardiovascular:  Tachycardic. No murmurs. Lungs:  Equal breath sounds bilat- ant auscultation, on VEnt  Abdomen:  Full, not tender Musculoskeletal:  Extremities now warm, no edema.. Skin:  Warm, moving exrtremities  LABS:  BMET  Recent Labs Lab 09/06/15 0406  09/06/15 0908 09/06/15 1206 09/06/15 1835 09/07/15 0404  NA 129*  < > 132* 133*  --  127*  K 2.7*  < > 3.0* 3.0* 3.9 4.7  CL 92*  < > 94* 91*  --  86*  CO2 16*  --  18*  --   --  18*  BUN 28*  < > 27* 28*   --  30*  CREATININE 2.50*  < > 2.63* 2.50*  --  3.50*  GLUCOSE 569*  < > 301* 171*  --  199*  < > = values in this interval not displayed.  Electrolytes  Recent Labs Lab 2015/09/14 0921 09/06/15 0406 09/06/15 0908 09/07/15 0404  CALCIUM 7.0* 6.1* 6.3* 6.5*  MG 2.0 1.3*  --  1.7  PHOS 8.4* 5.0*  --  4.3    CBC  Recent Labs Lab 09/14/2015 0921 09/06/15 0406 09/06/15 0859 09/06/15 1206 09/07/15 0810  WBC 14.6* 21.6*  --   --  18.9*  HGB 13.3 12.4 13.9 13.9 11.6*  HCT 42.6 37.6 41.0 41.0 32.7*  PLT 237 257  --   --  225    Coag's  Recent Labs Lab 2015/09/14 0921  09/06/15 0955 09/06/15 1835 09/07/15 0450  APTT 32  < > 64* 128* 112*  INR 1.73*  --   --   --   --   < > = values in this interval not displayed.  Sepsis Markers  Recent Labs Lab 09-14-2015 0921 14-Sep-2015 1603 09/06/15 0406 09/06/15 0909 09/07/15 0404  LATICACIDVEN 7.1* 7.7*  --  7.4*  --   PROCALCITON 0.50  --  11.59  --  23.24    ABG  Recent Labs Lab 14-Sep-2015 1717 09/14/2015 2103 09/06/15 0855  PHART 7.117* 7.252* 7.464*  PCO2ART  53.7* 29.3* 30.7*  PO2ART 66.0* 66.0* 95.0    Liver Enzymes  Recent Labs Lab 09/06/15 0406 09/06/15 1315 09/07/15 0404  AST 3993* 3775* 2087*  ALT 3090* 2833* 2759*  ALKPHOS 58 60 77  BILITOT 0.9 1.1 1.8*  ALBUMIN 1.8* 1.9* 2.1*  1.9*    Cardiac Enzymes  Recent Labs Lab 09/09/2015 1440 09/06/2015 2309 09/06/15 0908  TROPONINI 2.28* 3.10* 4.18*    Glucose  Recent Labs Lab 09/06/15 1940 09/06/15 2026 09/06/15 2129 09/06/15 2351 09/07/15 0424 09/07/15 0752  GLUCAP 120* 124* 126* 155* 157* 226*    Imaging Dg Chest Port 1 View  09/07/2015  CLINICAL DATA:  Respiratory failure. EXAM: PORTABLE CHEST 1 VIEW COMPARISON:  09/06/2015. FINDINGS: Endotracheal tube, NG tube, right IJ line stable position. Persistent but partially clearing dense bilateral pulmonary infiltrates. Small left pleural effusion. No pneumothorax . Heart size stable. No  pneumothorax. IMPRESSION: 1. Lines and tubes in stable position. 2. Persistent but partially clearing dense bilateral pulmonary infiltrates/pulmonary edema. Electronically Signed   By: Maisie Fus  Register   On: 09/07/2015 07:00   STUDIES:  4/3  Echo > EF- 35-40%, diffuse hypokinesis, severe mitral regurg, moderately reduced systolic function.   CULTURES: MRSA 4/3 (+) Trache 4/3 >> No organisms seen few yeast>> Blood 4/3 > still no growth >>  ANTIBIOTICS: Zosyn 4/3 > Vanc 4/3 >  Doxycy 4/4 >  SIGNIFICANT EVENTS: 4/3 admitted for resp failure. Intubated.  4/4 had hypotension post amiod bolus > had epi pushes and inc pressors.  4/4 D/c amiod rates still the same, D/c Epi drip, nephrology consulted  LINES/TUBES: R IJ 4/3 >   DISCUSSION: 65 female, admitted for dyspnea, intubated for hypoxemic respiratory failure. Hypotensive. Known to have chronic A. fib but noncompliant with medicines. Chest x-ray with pulmonary edema. Behaving like septic picture as well- with elevated WBC, hypothermia yesterday that has improved, with persistent lactic acidosis. Also increased troponin. Still anuric, nephrology consulted.   ASSESSMENT / PLAN:  PULMONARY A: Acute hypoxemic respiratory failure secondary to pulmonary edema,  new mild systolic heart failure likley due to severe acidosis,  Rapid atrial fibrillation, demand ischemia.  Concern for healthcare associated/community acq pneumonia. Pulm edema, new effusions P:   Continue on the ventilator for now.  Weaning and tolerating with intact mental status.  Will cont broad- spectrum antibiotics for now for Pneumonia, with increasing pro-cal and WBC Cont Doxy for atypical coverage, Azithro Contra-ind with prolonged Qtc Panculture- pending. De-escalate with culture. Needs diuresis.  CARDIOVASCULAR A:  Chronic atrial fibrillation, not compliant with medications. CHF pEF exacerbation.  Pulmonary edema atrial flutter Hypotension Prolonged  Qtc Increased troponin P:  Was on eliquis, Now on heparin drip. Diuresis, still anuric Increased trop likely due to demand, and reduced clearance with also AKI, ?baseline Cr.  Cards recs appreciated Off amiod, as no effect on rates with Hypotension Neosyn and Levofed pressors, D/c vasopressin Wean off pressors  RENAL A:   Patient likely has CKD. AKI/CKD, worsening Cr- 1.6>>>3.5 Persistent Anion gap metabolic acidosis from Azotemia Lactic acidosis, persistent Anuric Hypokalemia- Repleted P:   Nephology consulted- Recommend Oral Bicarb will order, IV lasix- increased to 160 TID D/c IV bicarb Chest x-ray with pulmonary edema. We'll hold off on hydrating.  Will need diuresis.   GASTROINTESTINAL A:   Transaminitis likely(3000) related to septic shock P:   Start TF- trickle. Observe LFTs ASA and tylenol- Normal.  HEMATOLOGIC A:   On eliquis at home for afib Anemia leukoctosis- 18.9 P:  Observe Hb/Hct  Heparin gtt  INFECTIOUS A:   Possible sepsis. Possible HCAP, Vs CAP P:   Panculture- pending Vanc/zosyn/doxy pending cultures.   ENDOCRINE A:   Hyperglycemia Improved, was on IVF with dextrose. P:   SSI- Q4H.  NEUROLOGIC A:   No issues P:   RASS goal: 0- 01 Not requiring sedation for now. May need fentanyl/versed prn  Inocente SallesEjiro Rael Tilly, MD. PGY-3 IMTS [phone]

## 2015-09-07 NOTE — Progress Notes (Signed)
ANTICOAGULATION CONSULT NOTE - Follow Up Consult  Pharmacy Consult for Heparin Indication: atrial fibrillation  Allergies  Allergen Reactions  . Sulfa Antibiotics     Obtained from [employer] EMR system      Patient Measurements: Height: 5\' 5"  (165.1 cm) Weight: 189 lb 9.5 oz (86 kg) IBW/kg (Calculated) : 57 Heparin Dosing Weight: 72.6 kg  Vital Signs: Temp: 99 F (37.2 C) (04/05 1135) Temp Source: Oral (04/05 1135) BP: 107/49 mmHg (04/05 1530) Pulse Rate: 124 (04/05 1530)  Labs:  Recent Labs  11/08/2015 0921 11/08/2015 1440  11/08/2015 2309 09/06/15 0406 09/06/15 0859 09/06/15 0908  09/06/15 1206 09/06/15 1835 09/07/15 0404 09/07/15 0450 09/07/15 0810 09/07/15 1510  HGB 13.3  --   --   --  12.4 13.9  --   --  13.9  --   --   --  11.6*  --   HCT 42.6  --   --   --  37.6 41.0  --   --  41.0  --   --   --  32.7*  --   PLT 237  --   --   --  257  --   --   --   --   --   --   --  225  --   APTT 32  --   < >  --  181*  --   --   < >  --  128*  --  112*  --  60*  LABPROT 20.3*  --   --   --   --   --   --   --   --   --   --   --   --   --   INR 1.73*  --   --   --   --   --   --   --   --   --   --   --   --   --   HEPARINUNFRC  --   --   < >  --  1.36*  --   --   --   --   --   --  0.84*  --  0.52  CREATININE 1.66*  --   --   --  2.50* 2.50* 2.63*  --  2.50*  --  3.50*  --   --   --   TROPONINI 0.87* 2.28*  --  3.10*  --   --  4.18*  --   --   --   --   --   --   --   < > = values in this interval not displayed.  Estimated Creatinine Clearance: 12.5 mL/min (by C-G formula based on Cr of 3.5).   Assessment: 82 year old female in IV heparin (switched from Eliquis) for atrial fibrillation. Heparin level may still be affected by apixaban (0.52) and aPTT slightly sub-therapeutic now at 60 on rate of 650 units/hr - difficult to get in range with being elevated on 750 units/hr.   Goal of Therapy:  Heparin level 0.3-0.7 units/ml aPTT 66-102 seconds Monitor platelets by  anticoagulation protocol: Yes   Plan:  Increase heparin gtt to 700 units/hr Check 8 hr aPTT and HL  Link SnufferJessica Zaneta Lightcap, PharmD, BCPS Clinical Pharmacist [phone]  09/07/2015 3:38 PM

## 2015-09-07 NOTE — Progress Notes (Signed)
TELEMETRY: Reviewed telemetry pt in atrial flutter with rate 120.: Filed Vitals:   09/07/15 0715 09/07/15 0730 09/07/15 0745 09/07/15 0755  BP:  92/59    Pulse: 114 114 112   Temp:    99.4 F (37.4 C)  TempSrc:    Oral  Resp: _0 Height:      Weight:      SpO2: 100% 100% 100%     Intake/Output Summary (Last 24 hours) at 09/07/15 0817 Last data filed at 09/07/15 0600  Gross per 24 hour  Intake 4922.57 ml  Output    405 ml  Net 4517.57 ml   Filed Weights   09/04/2015 0730 09/06/15 0500 09/07/15 0459  Weight: 75.8 kg (167 lb 1.7 oz) 76 kg (167 lb 8.8 oz) 86 kg (189 lb 9.5 oz)    Subjective Patient intubated but awake. States breathing is OK. No chest pain.  Marland Kitchen antiseptic oral rinse  7 mL Mouth Rinse QID  . chlorhexidine gluconate (SAGE KIT)  15 mL Mouth Rinse BID  . Chlorhexidine Gluconate Cloth  6 each Topical Q0600  . doxycycline (VIBRAMYCIN) IV  100 mg Intravenous Q12H  . feeding supplement (VITAL HIGH PROTEIN)  1,000 mL Per Tube Q24H  . furosemide  160 mg Intravenous Q8H  . insulin aspart  0-9 Units Subcutaneous 6 times per day  . ipratropium-albuterol  3 mL Nebulization QID  . mupirocin ointment  1 application Nasal BID  . pantoprazole (PROTONIX) IV  40 mg Intravenous QHS  . piperacillin-tazobactam (ZOSYN)  IV  2.25 g Intravenous Q8H  . vancomycin  750 mg Intravenous Q48H   . epinephrine Stopped (09/06/15 0547)  . heparin 750 Units/hr (09/06/15 2045)  . norepinephrine (LEVOPHED) Adult infusion 8 mcg/min (09/07/15 0345)  . phenylephrine (NEO-SYNEPHRINE) Adult infusion 150 mcg/min (09/07/15 0805)  .  sodium bicarbonate 150 mEq in sterile water 1000 mL infusion 50 mL/hr at 09/06/15 1311  . vasopressin (PITRESSIN) infusion - *FOR SHOCK* 0.03 Units/min (09/07/15 0806)    LABS: Basic Metabolic Panel:  Recent Labs  09/06/15 0406  09/06/15 0908 09/06/15 1206 09/06/15 1835 09/07/15 0404  NA 129*  < > 132* 133*  --  127*  K 2.7*  < > 3.0* 3.0* 3.9 4.7  CL  92*  < > 94* 91*  --  86*  CO2 16*  --  18*  --   --  18*  GLUCOSE 569*  < > 301* 171*  --  199*  BUN 28*  < > 27* 28*  --  30*  CREATININE 2.50*  < > 2.63* 2.50*  --  3.50*  CALCIUM 6.1*  --  6.3*  --   --  6.5*  MG 1.3*  --   --   --   --  1.7  PHOS 5.0*  --   --   --   --  4.3  < > = values in this interval not displayed. Liver Function Tests:  Recent Labs  09/06/15 0406 09/06/15 1315 09/07/15 0404  AST 3993* 3775*  --   ALT 3090* 2833*  --   ALKPHOS 58 60  --   BILITOT 0.9 1.1  --   PROT 4.5* 4.4*  --   ALBUMIN 1.8* 1.9* 1.9*   No results for input(s): LIPASE, AMYLASE in the last 72 hours. CBC:  Recent Labs  09/23/2015 0921 09/06/15 0406 09/06/15 0859 09/06/15 1206  WBC 14.6* 21.6*  --   --   NEUTROABS 10.2*  --   --   --  HGB 13.3 12.4 13.9 13.9  HCT 42.6 37.6 41.0 41.0  MCV 94.0 88.5  --   --   PLT 237 257  --   --    Cardiac Enzymes:  Recent Labs  09/20/2015 1440 09/04/2015 2309 09/06/15 0908  TROPONINI 2.28* 3.10* 4.18*   BNP: No results for input(s): PROBNP in the last 72 hours. D-Dimer: No results for input(s): DDIMER in the last 72 hours. Hemoglobin A1C: No results for input(s): HGBA1C in the last 72 hours. Fasting Lipid Panel: No results for input(s): CHOL, HDL, LDLCALC, TRIG, CHOLHDL, LDLDIRECT in the last 72 hours. Thyroid Function Tests: No results for input(s): TSH, T4TOTAL, T3FREE, THYROIDAB in the last 72 hours.  Invalid input(s): FREET3   Radiology/Studies:  Dg Chest Port 1 View  09/07/2015  CLINICAL DATA:  Respiratory failure. EXAM: PORTABLE CHEST 1 VIEW COMPARISON:  09/06/2015. FINDINGS: Endotracheal tube, NG tube, right IJ line stable position. Persistent but partially clearing dense bilateral pulmonary infiltrates. Small left pleural effusion. No pneumothorax . Heart size stable. No pneumothorax. IMPRESSION: 1. Lines and tubes in stable position. 2. Persistent but partially clearing dense bilateral pulmonary infiltrates/pulmonary edema.  Electronically Signed   By: [place]   On: 09/07/2015 07:00   Dg Chest Port 1 View  09/06/2015  CLINICAL DATA:  Respiratory distress. EXAM: PORTABLE CHEST 1 VIEW COMPARISON:  09/26/2015. FINDINGS: Endotracheal tube, NG tube, right IJ line stable position. Heart size stable. Worsening of diffuse dense bilateral pulmonary infiltrates. Bilateral pleural effusions noted on today's exam. No pneumothorax. IMPRESSION: 1. Lines and tubes in stable position. 2. Worsening of dense bilateral pulmonary infiltrates/ pulmonary edema. New onset bilateral pleural effusions. Electronically Signed   By: Marcello Moores  Register   On: 09/06/2015 07:23   Dg Chest Port 1 View  09/30/2015  CLINICAL DATA:  Central line placement. EXAM: PORTABLE CHEST 1 VIEW COMPARISON:  09/30/2015 at 0732 hours FINDINGS: Endotracheal tube remains 1.5-2 cm above the carina. Right jugular central venous catheter has been placed and terminates over the lower SVC. Enteric tube has been placed and courses into the left upper abdomen with tip not imaged. Cardiomediastinal silhouette is unchanged. Thoracic aortic calcification is noted. Bilateral lung opacities have not significantly changed and are greatest in the perihilar regions. There is a small right pleural effusion. No pneumothorax is identified. IMPRESSION: 1. Support devices as above. 2. Unchanged airspace opacities likely reflecting pulmonary edema. Small right pleural effusion. Electronically Signed   By: Logan Bores M.D.   On: 09/20/2015 13:06   Dg Chest Port 1 View  09/19/2015  CLINICAL DATA:  Failed attempted central line placement on the left today. EXAM: PORTABLE CHEST 1 VIEW COMPARISON:  09/24/2015 FINDINGS: Endotracheal tube remains in stable position. Interval placement of NG tube which enters the stomach. No pneumothorax. No visible Central line. Hyperinflation of the lungs compatible with COPD. Bilateral perihilar airspace opacities, likely edema, slightly worsened since prior study.  Heart is borderline in size. Suspect small layering effusions. IMPRESSION: Mild to moderate CHF, slightly worsened. Suspect small layering effusions. No pneumothorax. Electronically Signed   By: Rolm Baptise M.D.   On: 09/14/2015 11:56    PHYSICAL EXAM General: Elderly, intubated Head: Normal Neck: Negative for carotid bruits. JVD  elevated. No adenopathy Lungs: Coarse BS anteriorly. Breathing is unlabored. Heart: RRR, tachy,  S1 S2 with 2/6 systolic murmur LSB  Abdomen: Soft, non-tender, non-distended with normoactive bowel sounds. No hepatomegaly. No rebound/guarding. No obvious abdominal masses. Msk:  Strength and tone appears normal for  age. Extremities: 1+edema.  Distal pedal pulses are 2+ and equal bilaterally. Neuro: Alert and oriented X 3. Moves all extremities spontaneously. Psych:  Responds to questions appropriately with a normal affect.  ASSESSMENT AND PLAN: 1. Atrial flutter with RVR. Patient became hypotensive after IV amiodarone bolus. Amiodarone discontinued yesterday presumably due to hypotension. Rate is stable despite high dose pressors. AFlutter is chronic so not likely to respond to DCCV especially with marked LA enlargement. On IV heparin. Not a candidate for beta blocker or diltiazem due to hypotension and shock.   2. Acute on chronic systolic CHF. EF  35-40%. persistent profound hypotension. Requiring high dose pressor support with Vasopressin, Levophed, and Neo. Continue pressor support for now. No response to lasix with no urine output. Renal on board. Possible CVVHD today.  3. Acute renal failure-oliguric  4. Shock liver   5. Acute respiratory failure with hypoxemia- on vent. Management per CCM  6. Moderate Aortic stenosis  7. Severe MR.  8. Elevated troponin. Most likely due to demand ischemia in setting of shock, tachycardia, and hypotension. Not a candidate for invasive cardiac evaluation.  Present on Admission:  . Acute respiratory failure ([place]) .  Respiratory acidosis . Metabolic acidosis . Dyspnea . Atrial flutter ([place]) . Demand ischemia [facility])  Signed, Lindsy Cerullo [place], [place] 09/07/2015 8:17 AM

## 2015-09-07 NOTE — Progress Notes (Signed)
S: Denies pain O:BP 92/59 mmHg  Pulse 112  Temp(Src) 99.4 F (37.4 C) (Oral)  Resp 26  Ht 5' 5"  (1.651 m)  Wt 86 kg (189 lb 9.5 oz)  BMI 31.55 kg/m2  SpO2 100%  Intake/Output Summary (Last 24 hours) at 09/07/15 0805 Last data filed at 09/07/15 0600  Gross per 24 hour  Intake 4922.57 ml  Output    405 ml  Net 4517.57 ml   Weight change: 10.2 kg (22 lb 7.8 oz) VQM:GQQPY and alert, intubated PPJ:KDTOI, reg Resp:decreased BS bases Abd:+ BS NTND Ext: tr edema NEURO:CNI follows commands, no asterixis   . antiseptic oral rinse  7 mL Mouth Rinse QID  . chlorhexidine gluconate (SAGE KIT)  15 mL Mouth Rinse BID  . Chlorhexidine Gluconate Cloth  6 each Topical Q0600  . doxycycline (VIBRAMYCIN) IV  100 mg Intravenous Q12H  . feeding supplement (VITAL HIGH PROTEIN)  1,000 mL Per Tube Q24H  . furosemide  160 mg Intravenous BID  . insulin aspart  0-9 Units Subcutaneous 6 times per day  . ipratropium-albuterol  3 mL Nebulization QID  . mupirocin ointment  1 application Nasal BID  . pantoprazole (PROTONIX) IV  40 mg Intravenous QHS  . piperacillin-tazobactam (ZOSYN)  IV  2.25 g Intravenous Q8H  . vancomycin  750 mg Intravenous Q48H   Dg Chest Port 1 View  09/07/2015  CLINICAL DATA:  Respiratory failure. EXAM: PORTABLE CHEST 1 VIEW COMPARISON:  09/06/2015. FINDINGS: Endotracheal tube, NG tube, right IJ line stable position. Persistent but partially clearing dense bilateral pulmonary infiltrates. Small left pleural effusion. No pneumothorax . Heart size stable. No pneumothorax. IMPRESSION: 1. Lines and tubes in stable position. 2. Persistent but partially clearing dense bilateral pulmonary infiltrates/pulmonary edema. Electronically Signed   By: [place]   On: 09/07/2015 07:00   Dg Chest Port 1 View  09/06/2015  CLINICAL DATA:  Respiratory distress. EXAM: PORTABLE CHEST 1 VIEW COMPARISON:  09/22/2015. FINDINGS: Endotracheal tube, NG tube, right IJ line stable position. Heart size  stable. Worsening of diffuse dense bilateral pulmonary infiltrates. Bilateral pleural effusions noted on today's exam. No pneumothorax. IMPRESSION: 1. Lines and tubes in stable position. 2. Worsening of dense bilateral pulmonary infiltrates/ pulmonary edema. New onset bilateral pleural effusions. Electronically Signed   By: Marcello Moores  Register   On: 09/06/2015 07:23   Dg Chest Port 1 View  09/18/2015  CLINICAL DATA:  Central line placement. EXAM: PORTABLE CHEST 1 VIEW COMPARISON:  09/28/2015 at 0732 hours FINDINGS: Endotracheal tube remains 1.5-2 cm above the carina. Right jugular central venous catheter has been placed and terminates over the lower SVC. Enteric tube has been placed and courses into the left upper abdomen with tip not imaged. Cardiomediastinal silhouette is unchanged. Thoracic aortic calcification is noted. Bilateral lung opacities have not significantly changed and are greatest in the perihilar regions. There is a small right pleural effusion. No pneumothorax is identified. IMPRESSION: 1. Support devices as above. 2. Unchanged airspace opacities likely reflecting pulmonary edema. Small right pleural effusion. Electronically Signed   By: Logan Bores M.D.   On: 09/24/2015 13:06   Dg Chest Port 1 View  09/11/2015  CLINICAL DATA:  Failed attempted central line placement on the left today. EXAM: PORTABLE CHEST 1 VIEW COMPARISON:  09/21/2015 FINDINGS: Endotracheal tube remains in stable position. Interval placement of NG tube which enters the stomach. No pneumothorax. No visible Central line. Hyperinflation of the lungs compatible with COPD. Bilateral perihilar airspace opacities, likely edema, slightly worsened since  prior study. Heart is borderline in size. Suspect small layering effusions. IMPRESSION: Mild to moderate CHF, slightly worsened. Suspect small layering effusions. No pneumothorax. Electronically Signed   By: Rolm Baptise M.D.   On: 09/09/2015 11:56   BMET    Component Value Date/Time    NA 127* 09/07/2015 0404   K 4.7 09/07/2015 0404   CL 86* 09/07/2015 0404   CO2 18* 09/07/2015 0404   GLUCOSE 199* 09/07/2015 0404   BUN 30* 09/07/2015 0404   CREATININE 3.50* 09/07/2015 0404   CALCIUM 6.5* 09/07/2015 0404   GFRNONAA 11* 09/07/2015 0404   GFRAA 13* 09/07/2015 0404   CBC    Component Value Date/Time   WBC 21.6* 09/06/2015 0406   RBC 4.25 09/06/2015 0406   HGB 13.9 09/06/2015 1206   HCT 41.0 09/06/2015 1206   PLT 257 09/06/2015 0406   MCV 88.5 09/06/2015 0406   MCH 29.2 09/06/2015 0406   MCHC 33.0 09/06/2015 0406   RDW 14.4 09/06/2015 0406   LYMPHSABS 3.1 09/03/2015 0921   MONOABS 1.3* 09/17/2015 0921   EOSABS 0.0 09/25/2015 0921   BASOSABS 0.0 09/03/2015 0921     Assessment:  1. Presumably ARF that is most likely related to ischemic ATN in face of ARB.  UO minimal 2. Cardiomyopathy and valvular ht ds, mod AS and severe MR 3. Met acidosis on IV bicarb 4. Hyponatremia 5. Hypocalcemia  Plan: 1. Need to limit hypotonic fluids as much as possible 2. Check renal US 3. Increase lasix 4. If UO does not improve then will need CVVHD 5. Change to po bicitra and DC IV bicarb 6. Redose IV calcium  Brandun Pinn T

## 2015-09-07 NOTE — Progress Notes (Signed)
ANTICOAGULATION CONSULT NOTE - Follow Up Consult  Pharmacy Consult for Heparin Indication: atrial fibrillation  Allergies  Allergen Reactions  . Sulfa Antibiotics     Obtained from [employer] EMR system      Patient Measurements: Height: 5\' 5"  (165.1 cm) Weight: 189 lb 9.5 oz (86 kg) IBW/kg (Calculated) : 57 Heparin Dosing Weight: 72.6 kg  Vital Signs: Temp: 98.5 F (36.9 C) (04/05 0426) Temp Source: Oral (04/05 0426) BP: 105/62 mmHg (04/05 0600) Pulse Rate: 114 (04/05 0600)  Labs:  Recent Labs  2015/09/15 0921 2015/09/15 1440 2015/09/15 2308 2015/09/15 2309 09/06/15 0406 09/06/15 0859 09/06/15 0908 09/06/15 0955 09/06/15 1206 09/06/15 1835 09/07/15 0404 09/07/15 0450  HGB 13.3  --   --   --  12.4 13.9  --   --  13.9  --   --   --   HCT 42.6  --   --   --  37.6 41.0  --   --  41.0  --   --   --   PLT 237  --   --   --  257  --   --   --   --   --   --   --   APTT 32  --  125*  --  181*  --   --  64*  --  128*  --  112*  LABPROT 20.3*  --   --   --   --   --   --   --   --   --   --   --   INR 1.73*  --   --   --   --   --   --   --   --   --   --   --   HEPARINUNFRC  --   --  1.26*  --  1.36*  --   --   --   --   --   --  0.84*  CREATININE 1.66*  --   --   --  2.50* 2.50* 2.63*  --  2.50*  --  3.50*  --   TROPONINI 0.87* 2.28*  --  3.10*  --   --  4.18*  --   --   --   --   --     Estimated Creatinine Clearance: 12.5 mL/min (by C-G formula based on Cr of 3.5).   Assessment: 82 year old female in IV heparin (switched from Eliquis) for atrial fibrillation. Heparin level may still be affected by apixaban (0.84) although appears that it may be almost correlating with aPTT at this time as aPTT remains supratherapeutic (112 sec) on 750 units/hr. CBC stable.   Goal of Therapy:  Heparin level 0.3-0.7 units/ml aPTT 66-102 seconds Monitor platelets by anticoagulation protocol: Yes   Plan:  Decrease heparin gtt to 650 units/hr Check 8 hr aPTT and HL  Christoper Fabianaron Lyla Jasek,  PharmD, BCPS Clinical pharmacist, pager [phone] 09/07/2015 7:09 AM

## 2015-09-08 ENCOUNTER — Inpatient Hospital Stay (HOSPITAL_COMMUNITY): Payer: Medicare Other

## 2015-09-08 DIAGNOSIS — I5023 Acute on chronic systolic (congestive) heart failure: Secondary | ICD-10-CM | POA: Diagnosis present

## 2015-09-08 LAB — HEPATIC FUNCTION PANEL
ALK PHOS: 68 U/L (ref 38–126)
ALT: 1790 U/L — ABNORMAL HIGH (ref 14–54)
AST: 623 U/L — ABNORMAL HIGH (ref 15–41)
Albumin: 1.7 g/dL — ABNORMAL LOW (ref 3.5–5.0)
BILIRUBIN INDIRECT: 1.1 mg/dL — AB (ref 0.3–0.9)
Bilirubin, Direct: 1.1 mg/dL — ABNORMAL HIGH (ref 0.1–0.5)
TOTAL PROTEIN: 4.7 g/dL — AB (ref 6.5–8.1)
Total Bilirubin: 2.2 mg/dL — ABNORMAL HIGH (ref 0.3–1.2)

## 2015-09-08 LAB — RENAL FUNCTION PANEL
ALBUMIN: 1.7 g/dL — AB (ref 3.5–5.0)
ANION GAP: 19 — AB (ref 5–15)
BUN: 37 mg/dL — ABNORMAL HIGH (ref 6–20)
CALCIUM: 6.8 mg/dL — AB (ref 8.9–10.3)
CO2: 21 mmol/L — ABNORMAL LOW (ref 22–32)
Chloride: 83 mmol/L — ABNORMAL LOW (ref 101–111)
Creatinine, Ser: 3.9 mg/dL — ABNORMAL HIGH (ref 0.44–1.00)
GFR, EST AFRICAN AMERICAN: 11 mL/min — AB (ref >60)
GFR, EST NON AFRICAN AMERICAN: 10 mL/min — AB (ref >60)
Glucose, Bld: 273 mg/dL — ABNORMAL HIGH (ref 65–99)
PHOSPHORUS: 3.1 mg/dL (ref 2.5–4.6)
Potassium: 3.9 mmol/L (ref 3.5–5.1)
SODIUM: 123 mmol/L — AB (ref 135–145)

## 2015-09-08 LAB — CBC
HCT: 32.3 % — ABNORMAL LOW (ref 36.0–46.0)
HEMOGLOBIN: 10.9 g/dL — AB (ref 12.0–15.0)
MCH: 27.8 pg (ref 26.0–34.0)
MCHC: 33.7 g/dL (ref 30.0–36.0)
MCV: 82.4 fL (ref 78.0–100.0)
Platelets: 221 10*3/uL (ref 150–400)
RBC: 3.92 MIL/uL (ref 3.87–5.11)
RDW: 14.5 % (ref 11.5–15.5)
WBC: 19.9 10*3/uL — ABNORMAL HIGH (ref 4.0–10.5)

## 2015-09-08 LAB — HEPARIN LEVEL (UNFRACTIONATED)
HEPARIN UNFRACTIONATED: 0.38 [IU]/mL (ref 0.30–0.70)
HEPARIN UNFRACTIONATED: 0.36 [IU]/mL (ref 0.30–0.70)

## 2015-09-08 LAB — MAGNESIUM: MAGNESIUM: 1.5 mg/dL — AB (ref 1.7–2.4)

## 2015-09-08 LAB — PROTIME-INR
INR: 1.8 — AB (ref 0.00–1.49)
PROTHROMBIN TIME: 20.9 s — AB (ref 11.6–15.2)

## 2015-09-08 LAB — APTT
APTT: 62 s — AB (ref 24–37)
APTT: 45 s — AB (ref 24–37)
aPTT: 59 seconds — ABNORMAL HIGH (ref 24–37)

## 2015-09-08 LAB — GLUCOSE, CAPILLARY
GLUCOSE-CAPILLARY: 199 mg/dL — AB (ref 65–99)
GLUCOSE-CAPILLARY: 193 mg/dL — AB (ref 65–99)
Glucose-Capillary: 209 mg/dL — ABNORMAL HIGH (ref 65–99)
Glucose-Capillary: 224 mg/dL — ABNORMAL HIGH (ref 65–99)
Glucose-Capillary: 213 mg/dL — ABNORMAL HIGH (ref 65–99)
Glucose-Capillary: 197 mg/dL — ABNORMAL HIGH (ref 65–99)

## 2015-09-08 LAB — CULTURE, RESPIRATORY W GRAM STAIN: Special Requests: NORMAL

## 2015-09-08 LAB — LACTIC ACID, PLASMA: LACTIC ACID, VENOUS: 2.9 mmol/L — AB (ref 0.5–2.0)

## 2015-09-08 LAB — VANCOMYCIN, TROUGH: VANCOMYCIN TR: 18 ug/mL (ref 10.0–20.0)

## 2015-09-08 MED ORDER — AMIODARONE HCL IN DEXTROSE 360-4.14 MG/200ML-% IV SOLN
30.0000 mg/h | INTRAVENOUS | Status: DC
  Administered 2015-09-08 – 2015-09-12 (×9): 30 mg/h via INTRAVENOUS
  Filled 2015-09-08 (×17): qty 200

## 2015-09-08 MED ORDER — INSULIN GLARGINE 100 UNIT/ML ~~LOC~~ SOLN
8.0000 [IU] | Freq: Every day | SUBCUTANEOUS | Status: DC
  Administered 2015-09-08: 8 [IU] via SUBCUTANEOUS
  Filled 2015-09-08 (×2): qty 0.08

## 2015-09-08 MED ORDER — AMIODARONE HCL 150 MG/3ML IV SOLN
75.0000 mg | Freq: Once | INTRAVENOUS | Status: DC
  Filled 2015-09-08: qty 3

## 2015-09-08 MED ORDER — PROCHLORPERAZINE EDISYLATE 5 MG/ML IJ SOLN
5.0000 mg | Freq: Once | INTRAMUSCULAR | Status: AC
  Administered 2015-09-08: 5 mg via INTRAVENOUS
  Filled 2015-09-08: qty 1

## 2015-09-08 MED ORDER — INSULIN ASPART 100 UNIT/ML ~~LOC~~ SOLN
0.0000 [IU] | Freq: Three times a day (TID) | SUBCUTANEOUS | Status: DC
  Administered 2015-09-08: 3 [IU] via SUBCUTANEOUS
  Administered 2015-09-08: 5 [IU] via SUBCUTANEOUS

## 2015-09-08 MED ORDER — INSULIN ASPART 100 UNIT/ML ~~LOC~~ SOLN
0.0000 [IU] | SUBCUTANEOUS | Status: DC
  Administered 2015-09-08 – 2015-09-09 (×2): 5 [IU] via SUBCUTANEOUS
  Administered 2015-09-09 – 2015-09-10 (×5): 2 [IU] via SUBCUTANEOUS
  Administered 2015-09-10: 3 [IU] via SUBCUTANEOUS
  Administered 2015-09-10: 2 [IU] via SUBCUTANEOUS
  Administered 2015-09-11: 3 [IU] via SUBCUTANEOUS
  Administered 2015-09-11 (×2): 2 [IU] via SUBCUTANEOUS
  Administered 2015-09-11: 3 [IU] via SUBCUTANEOUS
  Administered 2015-09-12 (×3): 2 [IU] via SUBCUTANEOUS

## 2015-09-08 MED ORDER — AMIODARONE LOAD VIA INFUSION
75.0000 mg | Freq: Once | INTRAVENOUS | Status: AC
  Administered 2015-09-08: 75 mg via INTRAVENOUS
  Filled 2015-09-08: qty 41.67

## 2015-09-08 MED ORDER — MAGNESIUM SULFATE IN D5W 10-5 MG/ML-% IV SOLN
1.0000 g | Freq: Once | INTRAVENOUS | Status: AC
  Administered 2015-09-08: 1 g via INTRAVENOUS
  Filled 2015-09-08: qty 100

## 2015-09-08 MED ORDER — DIGOXIN 0.25 MG/ML IJ SOLN
0.1250 mg | Freq: Once | INTRAMUSCULAR | Status: AC
  Administered 2015-09-08: 0.125 mg via INTRAVENOUS
  Filled 2015-09-08: qty 2

## 2015-09-08 MED ORDER — POLYETHYLENE GLYCOL 3350 17 G PO PACK
17.0000 g | PACK | Freq: Every day | ORAL | Status: DC
  Administered 2015-09-08 – 2015-09-09 (×2): 17 g via ORAL
  Filled 2015-09-08 (×4): qty 1

## 2015-09-08 MED ORDER — MIDAZOLAM HCL 2 MG/2ML IJ SOLN
2.0000 mg | Freq: Once | INTRAMUSCULAR | Status: AC
  Administered 2015-09-08: 2 mg via INTRAVENOUS

## 2015-09-08 MED ORDER — AMIODARONE HCL IN DEXTROSE 360-4.14 MG/200ML-% IV SOLN
INTRAVENOUS | Status: AC
  Filled 2015-09-08: qty 200

## 2015-09-08 MED ORDER — MIDAZOLAM HCL 2 MG/2ML IJ SOLN
INTRAMUSCULAR | Status: AC
  Filled 2015-09-08: qty 2

## 2015-09-08 MED ORDER — VITAL HIGH PROTEIN PO LIQD: 1000.0000 mL | ORAL | Status: DC

## 2015-09-08 MED ORDER — VITAL HIGH PROTEIN PO LIQD
1000.0000 mL | ORAL | Status: DC
  Administered 2015-09-08 – 2015-09-09 (×2): 1000 mL
  Administered 2015-09-09: 07:00:00
  Administered 2015-09-10 – 2015-09-11 (×3): 1000 mL
  Administered 2015-09-11: 14:00:00
  Administered 2015-09-12: 1000 mL
  Administered 2015-09-12: 10:00:00

## 2015-09-08 NOTE — Progress Notes (Signed)
   Pt has had difficult to control atrial flutter since this am. BP still low -- requiring pressors. Oliguric.  We re started amiodarone drip and she did not respond to it. Still with atrial flutter in 180s.  Cardiology has been notified. Plan for bolus of amiodarone.   I updated Randy [facility] whom[facility] decision for him.  Mentioned about over all poor prognosis. He agrees to DNR, no CPR, no ACLS. No HD as well. He agrees to continuing current Rx and no escalation of care. Harvie HeckRandy is on his way to [place] from [place]. Will readdress code status (? Comfort care) when he gets here.    Full note for today to follow.   Pollie MeyerJ. Angelo A de Dios, MD 09/08/2015, 12:51 PM [facility] Pager [phone] After 3 pm or if no answer, call [phone]

## 2015-09-08 NOTE — Progress Notes (Signed)
Pt vomit small amount of tube feed/bile colored fluid. Tube feed placed on hold and Dr. Cleda DaubE. Hoffman made aware. New orders carried out.

## 2015-09-08 NOTE — Progress Notes (Signed)
Pt's HR 120-150 aflutter, Dr. Cleda DaubE. Hoffman notified and new orders carried out.

## 2015-09-08 NOTE — Progress Notes (Signed)
CRITICAL VALUE ALERT  Critical value received:  Lactic Acid   Date of notification:  09/08/15  Time of notification:  11:12  Critical value read back:Yes.    Nurse who received alert:  Lamount Crankerrystal Renu Asby, RN  MD notified (1st page):  Dr. Christene Slatese Dios present on floor at time of paged and notified of results

## 2015-09-08 NOTE — Progress Notes (Signed)
ANTICOAGULATION/ANTIBIOTIC CONSULT NOTE - Follow Up Consult  Pharmacy Consult for Heparin, Vancomycin and Zosyn Indication: atrial fibrillation; empiric abx for sepsis  Allergies  Allergen Reactions  . Sulfa Antibiotics     Obtained from [employer] EMR system      Patient Measurements: Height: 5\' 5"  (165.1 cm) Weight: 189 lb 9.5 oz (86 kg) IBW/kg (Calculated) : 57 Heparin Dosing Weight: 72.6 kg  Vital Signs: Temp: 98.7 F (37.1 C) (04/05 2325) Temp Source: Oral (04/05 2325) BP: 119/64 mmHg (04/05 2300) Pulse Rate: 124 (04/05 2300)  Labs:  Recent Labs  09/21/2015 0921 09/11/2015 1440  09/27/2015 2309 09/06/15 0406 09/06/15 0859 09/06/15 0908  09/06/15 1206  09/07/15 0404 09/07/15 0450 09/07/15 0810 09/07/15 1510 09/07/15 2355  HGB 13.3  --   --   --  12.4 13.9  --   --  13.9  --   --   --  11.6*  --   --   HCT 42.6  --   --   --  37.6 41.0  --   --  41.0  --   --   --  32.7*  --   --   PLT 237  --   --   --  257  --   --   --   --   --   --   --  225  --   --   APTT 32  --   < >  --  181*  --   --   < >  --   < >  --  112*  --  60* 59*  LABPROT 20.3*  --   --   --   --   --   --   --   --   --   --   --   --   --   --   INR 1.73*  --   --   --   --   --   --   --   --   --   --   --   --   --   --   HEPARINUNFRC  --   --   < >  --  1.36*  --   --   --   --   --   --  0.84*  --  0.52 0.36  CREATININE 1.66*  --   --   --  2.50* 2.50* 2.63*  --  2.50*  --  3.50*  --   --   --   --   TROPONINI 0.87* 2.28*  --  3.10*  --   --  4.18*  --   --   --   --   --   --   --   --   < > = values in this interval not displayed.  Estimated Creatinine Clearance: 12.5 mL/min (by C-G formula based on Cr of 3.5).   Assessment: AC: 82 year old female in IV heparin (switched from Eliquis) for atrial fibrillation. Heparin level may still be affected by apixaban (0.36) and aPTT remains sub-therapeutic at 59 on rate of 700 units/hr. No issues with line or bleeding reported per RN.  ID: Vanc  1g/[facility]osyn[facility] empiric for sepsis >> continued Vanc/Zosyn here. LA 8>>7, PCT up 23, Tm 100, WBC trend down (18.9). Random vancomycin level 18 mcg/ml (~48 hr post first dose). Will likely accumulate on q48h dosing with SCr continuing upward trend.  Vanc 4/3 >>  Zosyn 4/2 >>  Azith 4/3 x1 dose  Doxy 4/4 >>   4/3 BCx: ngtd 4/3 UCx: negative  4/3 Sputum: few yeast  4/3 MRSA PCR: positive  4/3 Strep pneumonia negative   Goal of Therapy:  Heparin level 0.3-0.7 units/ml aPTT 66-102 seconds Monitor platelets by anticoagulation protocol: Yes  Vanc trough 15-20 mcg/ml   Plan:  Increase heparin gtt to 850 units/hr Check 8 hr aPTT and HL Give Vancomycin  IV tonight Consider vanc trough 72hr post tonight's dose, then redose based on level Continue Zosyn 2.25gm IV q8h  Christoper Fabian, PharmD, BCPS Clinical pharmacist, pager [phone] 09/08/2015 12:58 AM

## 2015-09-08 NOTE — Progress Notes (Signed)
1230 75mg  Amiodarone bolus given per cardiology, verified with Corliss SkainsJuan Claudio, RN. HR maintains atrial flutter in the 160s. Family at bedside and updated. Randy called, updated of new changes. Spoke with CCM physician, patient to be a DNR, with no escalation of care. Will continue to monitor patient and provide comforting measures to both patient and family.

## 2015-09-08 NOTE — Progress Notes (Signed)
0945 HR 120s in Atrial Fibrillation/Atrial Flutter. 0945 Amioardone gtt initiated per cardiology request at 16.7 ml/hr, no initial bolus ordered.1000 HR 150s, CCM physician notified. HR maintaining upper 140s to mid 150s. Patient in no apparent signs of distress. No c/o pain or difficulty breathing. 1115 2mg  Versed given per CCM MD instruction. HR maintaining in atrial flutter. 1130 Dr. SwazilandJordan with cardiology paged. 1149 Text page sent to Dr. SwazilandJordan. Will continue to monitor patient appropriately and update physicians.

## 2015-09-08 NOTE — Progress Notes (Signed)
Nutrition Follow-up  DOCUMENTATION CODES:   Not applicable  INTERVENTION:    Increase Vital High Protein to goal rate of 65 ml/h (1560 ml per day) to provide 1560 kcals, 136.5 gm protein, 1304 ml free water daily.  NUTRITION DIAGNOSIS:   Inadequate oral intake related to inability to eat as evidenced by NPO status.  Ongoing  GOAL:   Patient will meet greater than or equal to 90% of their needs  Progressing  MONITOR:   Vent status, Labs, Weight trends, TF tolerance, I & O's  ASSESSMENT:   82 year old [facility] with respiratory distress and sepsis. [facility]  Labs reviewed: sodium and magnesium low. Discussed patient in ICU rounds and with RN today. Okay to advance TF to goal rate, RD to order. Weight is trending up, patient is not making urine. Will likely start CRRT 4/7.  Patient is currently receiving Vital High Protein via OGT at 20 ml/h (480 ml/day) to provide 480 kcals, 42 gm protein, 401 ml free water daily.   Patient is currently intubated on ventilator support MV: 12.6 L/min Temp (24hrs), Avg:98.7 F (37.1 C), Min:98.3 F (36.8 C), Max:99.1 F (37.3 C)   Diet Order:  Diet NPO time specified  Skin:  Reviewed, no issues  Last BM:  Unknown  Height:   Ht Readings from Last 1 Encounters:  12/13/2015 5\' 5"  (1.651 m)    Weight:   Wt Readings from Last 1 Encounters:  09/08/15 206 lb 2.1 oz (93.5 kg)  09/07/15 189 lb 9.5 oz (86 kg) 09/06/15 167 lb 8.8 oz (76 kg)        Ideal Body Weight:  56.8 kg  BMI:  27.88 (using admission weight)  Estimated Nutritional Needs:   Kcal:  1566  Protein:  >/= 115 gm  Fluid:  >/= 1.6 L  EDUCATION NEEDS:   No education needs identified at this time  Joaquin CourtsKimberly Haila Dena, RD, LDN, CNSC Pager [phone] After Hours Pager [phone]

## 2015-09-08 NOTE — Progress Notes (Signed)
S: Denies pain O:BP 119/71 mmHg  Pulse 125  Temp(Src) 98.3 F (36.8 C) (Oral)  Resp 30  Ht 5' 5"  (1.651 m)  Wt 93.5 kg (206 lb 2.1 oz)  BMI 34.30 kg/m2  SpO2 100%  Intake/Output Summary (Last 24 hours) at 09/08/15 7858 Last data filed at 09/08/15 0600  Gross per 24 hour  Intake 4308.48 ml  Output      0 ml  Net 4308.48 ml   Weight change: 7.5 kg (16 lb 8.6 oz) IFO:YDXAJ and alert, intubated OIN:OMVEH, reg Resp:decreased BS bases Abd:+ BS NTND Ext: tr edema NEURO:CNI follows commands, no asterixis   . antiseptic oral rinse  7 mL Mouth Rinse QID  . chlorhexidine gluconate (SAGE KIT)  15 mL Mouth Rinse BID  . citric acid-sodium citrate  30 mL Oral TID WC & HS  . doxycycline (VIBRAMYCIN) IV  100 mg Intravenous Q12H  . feeding supplement (VITAL HIGH PROTEIN)  1,000 mL Per Tube Q24H  . furosemide  160 mg Intravenous Q8H  . insulin aspart  0-9 Units Subcutaneous 6 times per day  . ipratropium-albuterol  3 mL Nebulization QID  . mupirocin ointment  1 application Nasal BID  . pantoprazole (PROTONIX) IV  40 mg Intravenous QHS  . piperacillin-tazobactam (ZOSYN)  IV  2.25 g Intravenous Q8H  . sodium bicarbonate  650 mg Oral BID   US Renal  09/07/2015  CLINICAL DATA:  Acute renal failure EXAM: RENAL / URINARY TRACT ULTRASOUND COMPLETE COMPARISON:  None. FINDINGS: Right Kidney: Length: 11 cm. Echogenicity within normal limits. No mass or hydronephrosis visualized. Left Kidney: Length: 12.2 cm. Echogenicity within normal limits. There is no hydronephrosis. A cyst in upper pole measures 5.3 x 6 cm. Cyst in lower pole measures 6.1 x 5 cm. Bladder: Urinary bladder is not visualized decompressed with Foley catheter. Bilateral pleural effusion. IMPRESSION: 1. No hydronephrosis. A cyst in upper pole of the left kidney measures 6 cm. A cyst in lower pole of the left kidney measures 6.1 cm. No renal calculi. 2. Bilateral small pleural effusion. 3. The urinary bladder is not visualized decompressed  by Foley catheter. Electronically Signed   By: Lahoma Crocker M.D.   On: 09/07/2015 15:04   Dg Chest Port 1 View  09/07/2015  CLINICAL DATA:  Respiratory failure. EXAM: PORTABLE CHEST 1 VIEW COMPARISON:  09/06/2015. FINDINGS: Endotracheal tube, NG tube, right IJ line stable position. Persistent but partially clearing dense bilateral pulmonary infiltrates. Small left pleural effusion. No pneumothorax . Heart size stable. No pneumothorax. IMPRESSION: 1. Lines and tubes in stable position. 2. Persistent but partially clearing dense bilateral pulmonary infiltrates/pulmonary edema. Electronically Signed   By: Marcello Moores  Register   On: 09/07/2015 07:00   BMET    Component Value Date/Time   NA 123* 09/08/2015 0355   K 3.9 09/08/2015 0355   CL 83* 09/08/2015 0355   CO2 21* 09/08/2015 0355   GLUCOSE 273* 09/08/2015 0355   BUN 37* 09/08/2015 0355   CREATININE 3.90* 09/08/2015 0355   CALCIUM 6.8* 09/08/2015 0355   GFRNONAA 10* 09/08/2015 0355   GFRAA 11* 09/08/2015 0355   CBC    Component Value Date/Time   WBC 19.9* 09/08/2015 0355   RBC 3.92 09/08/2015 0355   HGB 10.9* 09/08/2015 0355   HCT 32.3* 09/08/2015 0355   PLT 221 09/08/2015 0355   MCV 82.4 09/08/2015 0355   MCH 27.8 09/08/2015 0355   MCHC 33.7 09/08/2015 0355   RDW 14.5 09/08/2015 0355   LYMPHSABS 3.1 09/27/2015  6394   MONOABS 1.3* 09/18/2015 0921   EOSABS 0.0 09/13/2015 0921   BASOSABS 0.0 09/20/2015 0921     Assessment:  1. Presumably ARF that is most likely related to ischemic ATN in face of ARB.  UO minimal 2. Cardiomyopathy and valvular ht ds, mod AS and severe MR 3. Met acidosis, improved 4. Hyponatremia 5. Hypocalcemia, improved  Plan: 1. She is getting 2 different oral bicarb replacements, will DC Na bicarb and cont citrate 2.  She is still not making urine and suspect will need CVVHD though no overt indication for today.  If CCM wants, a catheter could be placed today in anticipation of CVVHD tomorrow 3. Cont IV  laisx 4. Daily labs  Lynnea Vandervoort T

## 2015-09-08 NOTE — Progress Notes (Signed)
Called by RN secondary to HR in the 160-170's despite resuming IV Amiodarone. I suggested a 75 mg bolus of Amiodarone which she received and tolerated. Her HR is still 145-155, B/P 96 systolic. I would give another 75 mg bolus in an hour if HR > 150. She is now DNR.  Corine ShelterLUKE Ranulfo Kall PA-C 09/08/2015 1:33 PM

## 2015-09-08 NOTE — Progress Notes (Signed)
PULMONARY / CRITICAL CARE MEDICINE   Name: Allison Ball MRN: [id number] DOB: 06/08/1927    ADMISSION DATE:  09/09/2015 CONSULTATION DATE:  09/23/2015  REFERRING MD:  ED  CHIEF COMPLAINT:  Resp distress.   Brief History- [place]. Initial presentation- dyspnea, EMS called, patient hypoxic, combative on bipap enroute and intubated in the ED.   SUBJECTIVE:  Awake, mouthing words around tube, appears comfortable. Overnight- hypotension and bradycardia after getting bolus of amiodarone. Given bolus epi and placed on epi drip. Also hyperglycemia. Also elctrolyte abnormality- hypoca, hypok, hypo mag, also with persistent Anion Gap.  VITAL SIGNS: BP 106/59 mmHg  Pulse 150  Temp(Src) 99.1 F (37.3 C) (Oral)  Resp 30  Ht 5\' 5"  (1.651 m)  Wt 206 lb 2.1 oz (93.5 kg)  BMI 34.30 kg/m2  SpO2 100%  HEMODYNAMICS:    VENTILATOR SETTINGS: Vent Mode:  [-] PRVC FiO2 (%):  [40 %] 40 % Set Rate:  [30 bmp] 30 bmp Vt Set:  [420 mL] 420 mL PEEP:  [5 cmH20-8 cmH20] 5 cmH20 Plateau Pressure:  [21 cmH20-26 cmH20] 25 cmH20  INTAKE / OUTPUT: I/O last 3 completed shifts: In: 6600.6 [I.V.:4734.6; NG/GT:600; IV Piggyback:1266] Out: 5 [Urine:5]  PHYSICAL EXAMINATION: General:  PERLA. ET tube in place, awake, following commands, RASS-0 Neuro:  CN grossly intact. HEENT:  Moist oral mucosa ET tube in place.  Cardiovascular:  Tachycardic. No murmurs. Lungs:  Equal breath sounds bilat- ant auscultation, on VEnt, no wheezes or crackles appreciates, ant auscultation Abdomen:  Full, not tender Musculoskeletal:  Extremities now warm, no edema.. Skin:  Warm, moving exrtremities  LABS:  BMET  Recent Labs Lab 09/06/15 0908 09/06/15 1206 09/06/15 1835 09/07/15 0404 09/08/15 0355  NA 132* 133*  --  127* 123*  K 3.0* 3.0* 3.9 4.7 3.9  CL 94* 91*  --  86* 83*  CO2 18*  --   --  18* 21*  BUN 27* 28*  --  30* 37*  CREATININE 2.63* 2.50*  --  3.50* 3.90*  GLUCOSE 301*  171*  --  199* 273*    Electrolytes  Recent Labs Lab 09/06/15 0406 09/06/15 0908 09/07/15 0404 09/08/15 0355  CALCIUM 6.1* 6.3* 6.5* 6.8*  MG 1.3*  --  1.7 1.5*  PHOS 5.0*  --  4.3 3.1   CBC  Recent Labs Lab 09/06/15 0406  09/06/15 1206 09/07/15 0810 09/08/15 0355  WBC 21.6*  --   --  18.9* 19.9*  HGB 12.4  < > 13.9 11.6* 10.9*  HCT 37.6  < > 41.0 32.7* 32.3*  PLT 257  --   --  225 221  < > = values in this interval not displayed.  Coag's  Recent Labs Lab 10/10/2015 0921  09/07/15 1510 09/07/15 2355 09/08/15 0355 09/08/15 0829  APTT 32  < > 60* 59*  --  62*  INR 1.73*  --   --   --  1.80*  --   < > = values in this interval not displayed.  Sepsis Markers  Recent Labs Lab 10/10/2015 0921 10/10/2015 1603 09/06/15 0406 09/06/15 0909 09/07/15 0404 09/08/15 1027  LATICACIDVEN 7.1* 7.7*  --  7.4*  --  2.9*  PROCALCITON 0.50  --  11.59  --  23.24  --    ABG  Recent Labs Lab 10/10/2015 1717 10/10/2015 2103 09/06/15 0855  PHART 7.117* 7.252* 7.464*  PCO2ART 53.7* 29.3* 30.7*  PO2ART 66.0* 66.0* 95.0    Liver Enzymes  Recent Labs Lab  09/06/15 1315 09/07/15 0404 09/08/15 0355  AST 3775* 2087* 623*  ALT 2833* 2759* 1790*  ALKPHOS 60 77 68  BILITOT 1.1 1.8* 2.2*  ALBUMIN 1.9* 2.1*  1.9* 1.7*  1.7*   Cardiac Enzymes  Recent Labs Lab 09/13/2015 1440 09/20/2015 2309 09/06/15 0908  TROPONINI 2.28* 3.10* 4.18*   Glucose  Recent Labs Lab 09/07/15 1550 09/07/15 2022 09/07/15 2328 09/08/15 0322 09/08/15 0750 09/08/15 1118  GLUCAP 241* 168* 199* 209* 224* 213*   Imaging US Renal  09/07/2015  CLINICAL DATA:  Acute renal failure EXAM: RENAL / URINARY TRACT ULTRASOUND COMPLETE COMPARISON:  None. FINDINGS: Right Kidney: Length: 11 cm. Echogenicity within normal limits. No mass or hydronephrosis visualized. Left Kidney: Length: 12.2 cm. Echogenicity within normal limits. There is no hydronephrosis. A cyst in upper pole measures 5.3 x 6 cm. Cyst in lower  pole measures 6.1 x 5 cm. Bladder: Urinary bladder is not visualized decompressed with Foley catheter. Bilateral pleural effusion. IMPRESSION: 1. No hydronephrosis. A cyst in upper pole of the left kidney measures 6 cm. A cyst in lower pole of the left kidney measures 6.1 cm. No renal calculi. 2. Bilateral small pleural effusion. 3. The urinary bladder is not visualized decompressed by Foley catheter. Electronically Signed   By: Natasha Mead M.D.   On: 09/07/2015 15:04   Dg Chest Port 1 View  09/08/2015  CLINICAL DATA:  Respiratory failure. EXAM: PORTABLE CHEST 1 VIEW COMPARISON:  09/07/2015. FINDINGS: Endotracheal tube, NG tube, right IJ line in stable position. Heart size stable. Diffuse bilateral pulmonary infiltrate and or edema again noted. Slight and interim progression. No pleural effusion or pneumothorax. IMPRESSION: 1. Lines and tubes in stable position. 2. Progressive diffuse bilateral pulmonary infiltrates and/or edema. Small bilateral pleural effusions. Electronically Signed   By: Maisie Fus  Register   On: 09/08/2015 07:24   STUDIES:  4/3  Echo > EF- 35-40%, diffuse hypokinesis, severe mitral regurg, moderately reduced systolic function.   CULTURES: MRSA 4/3 (+) Trache 4/3 >> No organisms seen few yeast>> Blood 4/3 > still no growth 3 days>>  ANTIBIOTICS: Zosyn 4/3 > Vanc 4/3 >  Doxycy 4/4 >  SIGNIFICANT EVENTS: 4/3 admitted for resp failure. Intubated.  4/4 had hypotension post amiod bolus > had epi pushes and inc pressors.  4/4 D/c amiod rates still the same, D/c Epi drip, nephrology consulted 4/6 Worsening tachycardia, Amiodarone bolus and drip restarted, Pt made DNR, comfort, no HD  LINES/TUBES: R IJ 4/3 >   DISCUSSION: 87 female, admitted for dyspnea, intubated for hypoxemic respiratory failure. Hypotensive. Known to have chronic A. fib but noncompliant with medicines. Chest x-ray with pulmonary edema. Possibly septic shock as well, with elevated procalcitonin. Initial lactic  acidosis improving.HAs been in atria flutter since admission, rates uncontrolled, now worse.  Also increased troponin, likely demand- cards consulted and not a candidate for intervention. Still anuric, nephrology consulted. Pts family updated, pt now DNR, No HD.  ASSESSMENT / PLAN:  PULMONARY A: Acute hypoxemic respiratory failure secondary to pulmonary edema,  new mild systolic heart failure likley due to severe acidosis,  Concern for pneumonia. New effusions P:   Continue on the ventilator for now.  Weaning and tolerating with intact mental status.  Will cont broad- spectrum antibiotics for now for Pneumonia, with increasing pro-cal and WBC Cont Doxy for atypical coverage, Azithro Contra-ind with prolonged Qtc Panculture- pending. De-escalate with culture. Needs diuresis. DNR  CARDIOVASCULAR A:  Chronic atrial fibrillation, not compliant with medications. CHF pEF exacerbation.  Pulmonary  edema atrial flutter Hypotension Prolonged Qtc Increased troponin P:  Was on eliquis, Now on heparin drip. Needs Diuresis, still anuric Increased trop likely due to demand, and reduced clearance with also AKI, ?baseline Cr.  Cards recs appreciated. Now on amiodarone, also bolused without improvement Neosyn and Levofed pressors, off vasopressin Wean off pressors EKG- check QTC- 440 Lactic acidosis improving  RENAL A:   Patient likely has CKD. AKI/CKD, worsening Cr- 1.6>>>3.9 Persistent Anion gap metabolic acidosis from Azotemia Lactic acidosis, persistent Anuric Hypokalemia- Repleted Fluid overload P:   Nephology  recs appreciated  No HD IV lasix- increased to 160 TID  GASTROINTESTINAL A:   Transaminitis likely(initially 3000)- improving related to septic shock P:   TF- trickle. Observe LFTs ASA and tylenol- Normal. No bowel movement, Miralax  HEMATOLOGIC A:   On eliquis at home for afib Anemia, slowly downtrending leukoctosis P:  Observe Hb/Hct Heparin  gtt  INFECTIOUS A:   Possible sepsis. Possible HCAP, Vs CAP P:   Panculture- pending Vanc/zosyn/doxy pending cultures.  Some mention of MRSA bacteremia not accurate, Per cultures here and at [place](called hospital to confirm micro results)  Pts blood culture result- Negative for growth.  ENDOCRINE A:   Hyperglycemia Improved, was on IVF with dextrose. P: SSI- Q4H.  NEUROLOGIC A: No issues P: RASS goal: 0- 01 Not requiring sedation for now. May need fentanyl/versed prn.  Discussion with family, pt now DNR, no HD.  Inocente Salles, MD. PGY-3 IMTS [phone]

## 2015-09-08 NOTE — Progress Notes (Signed)
TELEMETRY: Reviewed telemetry pt in atrial flutter with rate 122, thru the night HR increasing in the 150 range..: Filed Vitals:   09/08/15 0700 09/08/15 0750 09/08/15 0754 09/08/15 0803  BP: 124/65   117/52  Pulse: 148 167  155  Temp:   98.4 F (36.9 C)   TempSrc:   Oral   Resp: 30 16    Height:      Weight:      SpO2: 100% 100%      Intake/Output Summary (Last 24 hours) at 09/08/15 0826 Last data filed at 09/08/15 0750  Gross per 24 hour  Intake 4381.08 ml  Output      0 ml  Net 4381.08 ml   Filed Weights   09/06/15 0500 09/07/15 0459 09/08/15 0357  Weight: 76 kg (167 lb 8.8 oz) 86 kg (189 lb 9.5 oz) 93.5 kg (206 lb 2.1 oz)    Subjective Patient intubated but awake. States breathing is OK. No chest pain.  Marland Kitchen antiseptic oral rinse  7 mL Mouth Rinse QID  . chlorhexidine gluconate (SAGE KIT)  15 mL Mouth Rinse BID  . citric acid-sodium citrate  30 mL Oral TID WC & HS  . doxycycline (VIBRAMYCIN) IV  100 mg Intravenous Q12H  . feeding supplement (VITAL HIGH PROTEIN)  1,000 mL Per Tube Q24H  . furosemide  160 mg Intravenous Q8H  . insulin aspart  0-9 Units Subcutaneous 6 times per day  . ipratropium-albuterol  3 mL Nebulization QID  . magnesium sulfate 1 - 4 g bolus IVPB  1 g Intravenous Once  . mupirocin ointment  1 application Nasal BID  . pantoprazole (PROTONIX) IV  40 mg Intravenous QHS  . piperacillin-tazobactam (ZOSYN)  IV  2.25 g Intravenous Q8H   . heparin 850 Units/hr (09/08/15 0113)  . norepinephrine (LEVOPHED) Adult infusion 8 mcg/min (09/07/15 1900)  . phenylephrine (NEO-SYNEPHRINE) Adult infusion 180 mcg/min (09/08/15 0708)  . vasopressin (PITRESSIN) infusion - *FOR SHOCK* Stopped (09/07/15 0926)    LABS: Basic Metabolic Panel:  Recent Labs  09/07/15 0404 09/08/15 0355  NA 127* 123*  K 4.7 3.9  CL 86* 83*  CO2 18* 21*  GLUCOSE 199* 273*  BUN 30* 37*  CREATININE 3.50* 3.90*  CALCIUM 6.5* 6.8*  MG 1.7 1.5*  PHOS 4.3 3.1   Liver Function  Tests:  Recent Labs  09/07/15 0404 09/08/15 0355  AST 2087* 623*  ALT 2759* 1790*  ALKPHOS 77 68  BILITOT 1.8* 2.2*  PROT 5.2* 4.7*  ALBUMIN 2.1*  1.9* 1.7*  1.7*   No results for input(s): LIPASE, AMYLASE in the last 72 hours. CBC:  Recent Labs  09/27/2015 0921  09/07/15 0810 09/08/15 0355  WBC 14.6*  < > 18.9* 19.9*  NEUTROABS 10.2*  --   --   --   HGB 13.3  < > 11.6* 10.9*  HCT 42.6  < > 32.7* 32.3*  MCV 94.0  < > 83.8 82.4  PLT 237  < > 225 221  < > = values in this interval not displayed. Cardiac Enzymes:  Recent Labs  09/11/2015 1440 09/17/2015 2309 09/06/15 0908  TROPONINI 2.28* 3.10* 4.18*   BNP: No results for input(s): PROBNP in the last 72 hours. D-Dimer: No results for input(s): DDIMER in the last 72 hours. Hemoglobin A1C: No results for input(s): HGBA1C in the last 72 hours. Fasting Lipid Panel: No results for input(s): CHOL, HDL, LDLCALC, TRIG, CHOLHDL, LDLDIRECT in the last 72 hours. Thyroid Function Tests: No results  for input(s): TSH, T4TOTAL, T3FREE, THYROIDAB in the last 72 hours.  Invalid input(s): FREET3   Radiology/Studies:  US Renal  09/07/2015  CLINICAL DATA:  Acute renal failure EXAM: RENAL / URINARY TRACT ULTRASOUND COMPLETE COMPARISON:  None. FINDINGS: Right Kidney: Length: 11 cm. Echogenicity within normal limits. No mass or hydronephrosis visualized. Left Kidney: Length: 12.2 cm. Echogenicity within normal limits. There is no hydronephrosis. A cyst in upper pole measures 5.3 x 6 cm. Cyst in lower pole measures 6.1 x 5 cm. Bladder: Urinary bladder is not visualized decompressed with Foley catheter. Bilateral pleural effusion. IMPRESSION: 1. No hydronephrosis. A cyst in upper pole of the left kidney measures 6 cm. A cyst in lower pole of the left kidney measures 6.1 cm. No renal calculi. 2. Bilateral small pleural effusion. 3. The urinary bladder is not visualized decompressed by Foley catheter. Electronically Signed   By: Lahoma Crocker M.D.    On: 09/07/2015 15:04   Dg Chest Port 1 View  09/08/2015  CLINICAL DATA:  Respiratory failure. EXAM: PORTABLE CHEST 1 VIEW COMPARISON:  09/07/2015. FINDINGS: Endotracheal tube, NG tube, right IJ line in stable position. Heart size stable. Diffuse bilateral pulmonary infiltrate and or edema again noted. Slight and interim progression. No pleural effusion or pneumothorax. IMPRESSION: 1. Lines and tubes in stable position. 2. Progressive diffuse bilateral pulmonary infiltrates and/or edema. Small bilateral pleural effusions. Electronically Signed   By: Marcello Moores  Register   On: 09/08/2015 07:24   Dg Chest Port 1 View  09/07/2015  CLINICAL DATA:  Respiratory failure. EXAM: PORTABLE CHEST 1 VIEW COMPARISON:  09/06/2015. FINDINGS: Endotracheal tube, NG tube, right IJ line stable position. Persistent but partially clearing dense bilateral pulmonary infiltrates. Small left pleural effusion. No pneumothorax . Heart size stable. No pneumothorax. IMPRESSION: 1. Lines and tubes in stable position. 2. Persistent but partially clearing dense bilateral pulmonary infiltrates/pulmonary edema. Electronically Signed   By: Marcello Moores  Register   On: 09/07/2015 07:00    PHYSICAL EXAM General: Elderly, intubated Head: Normal Neck: Negative for carotid bruits. JVD  elevated. No adenopathy Lungs: Coarse BS anteriorly. Diminished BS bilateral bases Heart: RRR, tachy,  S1 S2 with 2/6 systolic murmur LSB  Abdomen: Soft, non-tender, non-distended with normoactive bowel sounds. No hepatomegaly. No rebound/guarding. No obvious abdominal masses. Msk:  Strength and tone appears normal for age. Extremities: 2+edema.  Distal pedal pulses are 2+ and equal bilaterally. Neuro: Alert and oriented X 3. Moves all extremities spontaneously. Psych:  Responds to questions appropriately with a normal affect.  ASSESSMENT AND PLAN: 1. Atrial flutter with RVR. Patient became hypotensive after IV amiodarone bolus before. Amiodarone discontinued   due  to hypotension. Rate is trending higher last night.  AFlutter is chronic so not likely to respond to DCCV especially with marked LA enlargement. On IV heparin. Not a candidate for beta blocker or diltiazem due to hypotension and shock. Digoxin is problematic in setting of renal failure. Only option would be to resume IV amiodarone without bolus for rate control  2. Acute on chronic systolic CHF. EF  35-40%. persistent  hypotension. Requiring  pressor support with Phenylephrine and Neo. Wean as tolerated.  No response to lasix with no urine output. Renal on board. Possible CVVHD tomorrow. She is significantly volume overloaded. I/O positive 15 liters and weight up 39 lbs since admit.  3. Acute renal failure-oliguric. Renal US benign.   4. Shock liver   5. Acute respiratory failure with hypoxemia- on vent. Management per CCM  6. Moderate Aortic stenosis  7. Severe MR.  8. Elevated troponin. Most likely due to demand ischemia in setting of shock, tachycardia, and hypotension. Not a candidate for invasive cardiac evaluation.  9. Hyponatremia.   Present on Admission:  . Acute respiratory failure ([place]) . Respiratory acidosis . Metabolic acidosis . Dyspnea . Atrial flutter ([place]) . Demand ischemia [facility])  Signed, Briah Nary Martin[place], [place] 09/08/2015 8:26 AM

## 2015-09-08 NOTE — Progress Notes (Signed)
Inpatient Diabetes Program Recommendations  AACE/ADA: New Consensus Statement on Inpatient Glycemic Control (2015)  Target Ranges:  Prepandial:   less than 140 mg/dL      Peak postprandial:   less than 180 mg/dL (1-2 hours)      Critically ill patients:  140 - 180 mg/dL   Review of Glycemic Control:  Results for Allison Ball, [name] (MRN [id number]) as of 09/08/2015 11:21  Ref. Range 09/07/2015 15:50 09/07/2015 20:22 09/07/2015 23:28 09/08/2015 03:22 09/08/2015 07:50  Glucose-Capillary Latest Ref Range: 65-99 mg/dL 409241 (H) 811168 (H) 914199 (H) 209 (H) 224 (H)   Inpatient Diabetes Program Recommendations:    Please consider adding Lantus 14 units daily.   Thanks, Beryl MeagerJenny Eleisha Branscomb, RN, BC-ADM Inpatient Diabetes Coordinator Pager [phone] (8a-5p)

## 2015-09-08 NOTE — Progress Notes (Addendum)
PT Cancellation Note  Patient Details Name: Allison Ball MRN: [id number] DOB: 04/20/1928   Cancelled Treatment:    Reason Eval/Treat Not Completed: Patient not medically ready Holding PT evaluation this AM as pt with elevated HR in A-flutter. Pt starting on amiodarone so will follow up in the PM.  Attempted PT eval in PM however pt HR in 150-160 bpm. Will follow.   Blake DivineShauna A Asuna Peth 09/08/2015, 8:30 AM Mylo RedShauna Yazlyn Wentzel, PT, DPT [phone]

## 2015-09-09 ENCOUNTER — Inpatient Hospital Stay (HOSPITAL_COMMUNITY): Payer: Medicare Other

## 2015-09-09 DIAGNOSIS — J9690 Respiratory failure, unspecified, unspecified whether with hypoxia or hypercapnia: Secondary | ICD-10-CM | POA: Insufficient documentation

## 2015-09-09 DIAGNOSIS — Z515 Encounter for palliative care: Secondary | ICD-10-CM | POA: Insufficient documentation

## 2015-09-09 LAB — CBC
HEMATOCRIT: 30.5 % — AB (ref 36.0–46.0)
Hemoglobin: 10.5 g/dL — ABNORMAL LOW (ref 12.0–15.0)
MCH: 28.3 pg (ref 26.0–34.0)
MCHC: 34.4 g/dL (ref 30.0–36.0)
MCV: 82.2 fL (ref 78.0–100.0)
Platelets: 205 10*3/uL (ref 150–400)
RBC: 3.71 MIL/uL — ABNORMAL LOW (ref 3.87–5.11)
RDW: 14.5 % (ref 11.5–15.5)
WBC: 19.1 10*3/uL — ABNORMAL HIGH (ref 4.0–10.5)

## 2015-09-09 LAB — HEPATIC FUNCTION PANEL
ALBUMIN: 1.5 g/dL — AB (ref 3.5–5.0)
ALT: 1118 U/L — ABNORMAL HIGH (ref 14–54)
ALT: 1003 U/L — ABNORMAL HIGH (ref 14–54)
AST: 236 U/L — AB (ref 15–41)
AST: 186 U/L — ABNORMAL HIGH (ref 15–41)
Albumin: 1.5 g/dL — ABNORMAL LOW (ref 3.5–5.0)
Alkaline Phosphatase: 93 U/L (ref 38–126)
Alkaline Phosphatase: 102 U/L (ref 38–126)
BILIRUBIN DIRECT: 0.9 mg/dL — AB (ref 0.1–0.5)
BILIRUBIN DIRECT: 0.7 mg/dL — AB (ref 0.1–0.5)
BILIRUBIN TOTAL: 1.6 mg/dL — AB (ref 0.3–1.2)
BILIRUBIN TOTAL: 1.6 mg/dL — AB (ref 0.3–1.2)
Indirect Bilirubin: 0.7 mg/dL (ref 0.3–0.9)
Indirect Bilirubin: 0.9 mg/dL (ref 0.3–0.9)
Total Protein: 4.5 g/dL — ABNORMAL LOW (ref 6.5–8.1)
Total Protein: 5 g/dL — ABNORMAL LOW (ref 6.5–8.1)

## 2015-09-09 LAB — GLUCOSE, CAPILLARY
GLUCOSE-CAPILLARY: 205 mg/dL — AB (ref 65–99)
GLUCOSE-CAPILLARY: 137 mg/dL — AB (ref 65–99)
GLUCOSE-CAPILLARY: 143 mg/dL — AB (ref 65–99)
GLUCOSE-CAPILLARY: 141 mg/dL — AB (ref 65–99)
GLUCOSE-CAPILLARY: 143 mg/dL — AB (ref 65–99)
Glucose-Capillary: 202 mg/dL — ABNORMAL HIGH (ref 65–99)

## 2015-09-09 LAB — RENAL FUNCTION PANEL
Albumin: 1.5 g/dL — ABNORMAL LOW (ref 3.5–5.0)
Anion gap: 17 — ABNORMAL HIGH (ref 5–15)
BUN: 44 mg/dL — ABNORMAL HIGH (ref 6–20)
CHLORIDE: 83 mmol/L — AB (ref 101–111)
CO2: 22 mmol/L (ref 22–32)
CREATININE: 4.37 mg/dL — AB (ref 0.44–1.00)
Calcium: 7 mg/dL — ABNORMAL LOW (ref 8.9–10.3)
GFR calc non Af Amer: 8 mL/min — ABNORMAL LOW (ref >60)
GFR, EST AFRICAN AMERICAN: 10 mL/min — AB (ref >60)
Glucose, Bld: 210 mg/dL — ABNORMAL HIGH (ref 65–99)
Phosphorus: 3.3 mg/dL (ref 2.5–4.6)
Potassium: 4 mmol/L (ref 3.5–5.1)
Sodium: 122 mmol/L — ABNORMAL LOW (ref 135–145)

## 2015-09-09 LAB — BASIC METABOLIC PANEL
Anion gap: 17 — ABNORMAL HIGH (ref 5–15)
BUN: 44 mg/dL — ABNORMAL HIGH (ref 6–20)
CHLORIDE: 83 mmol/L — AB (ref 101–111)
CO2: 23 mmol/L (ref 22–32)
CREATININE: 4.38 mg/dL — AB (ref 0.44–1.00)
Calcium: 6.9 mg/dL — ABNORMAL LOW (ref 8.9–10.3)
GFR calc non Af Amer: 8 mL/min — ABNORMAL LOW (ref >60)
GFR, EST AFRICAN AMERICAN: 10 mL/min — AB (ref >60)
Glucose, Bld: 220 mg/dL — ABNORMAL HIGH (ref 65–99)
Potassium: 3.9 mmol/L (ref 3.5–5.1)
Sodium: 123 mmol/L — ABNORMAL LOW (ref 135–145)

## 2015-09-09 LAB — HEPARIN LEVEL (UNFRACTIONATED)
HEPARIN UNFRACTIONATED: 0.21 [IU]/mL — AB (ref 0.30–0.70)
HEPARIN UNFRACTIONATED: 0.33 [IU]/mL (ref 0.30–0.70)

## 2015-09-09 LAB — APTT: APTT: 49 s — AB (ref 24–37)

## 2015-09-09 LAB — MAGNESIUM: Magnesium: 1.6 mg/dL — ABNORMAL LOW (ref 1.7–2.4)

## 2015-09-09 MED ORDER — MAGNESIUM SULFATE 2 GM/50ML IV SOLN
2.0000 g | Freq: Once | INTRAVENOUS | Status: AC
  Administered 2015-09-09: 2 g via INTRAVENOUS
  Filled 2015-09-09: qty 50

## 2015-09-09 MED ORDER — HEPARIN (PORCINE) IN NACL 100-0.45 UNIT/ML-% IJ SOLN
1050.0000 [IU]/h | INTRAMUSCULAR | Status: DC
  Administered 2015-09-09: 1050 [IU]/h via INTRAVENOUS
  Filled 2015-09-09 (×3): qty 250

## 2015-09-09 MED ORDER — PANTOPRAZOLE SODIUM 40 MG PO PACK
40.0000 mg | PACK | Freq: Every day | ORAL | Status: DC
  Administered 2015-09-09 – 2015-09-10 (×2): 40 mg
  Filled 2015-09-09 (×3): qty 20

## 2015-09-09 MED ORDER — INSULIN GLARGINE 100 UNIT/ML ~~LOC~~ SOLN
12.0000 [IU] | Freq: Every day | SUBCUTANEOUS | Status: DC
  Administered 2015-09-09 – 2015-09-12 (×4): 12 [IU] via SUBCUTANEOUS
  Filled 2015-09-09 (×4): qty 0.12

## 2015-09-09 MED ORDER — SENNOSIDES-DOCUSATE SODIUM 8.6-50 MG PO TABS
2.0000 | ORAL_TABLET | Freq: Every day | ORAL | Status: DC
  Administered 2015-09-09: 2 via ORAL
  Filled 2015-09-09 (×3): qty 2

## 2015-09-09 MED ORDER — SODIUM CHLORIDE 0.9 % IV SOLN: Freq: Once | INTRAVENOUS | Status: DC

## 2015-09-09 NOTE — Progress Notes (Signed)
S: Denies pain  Intubated O:BP 116/73 mmHg  Pulse 150  Temp(Src) 98.6 F (37 C) (Oral)  Resp 44  Ht _0  (1.651 m)  Wt 95.2 kg (209 lb 14.1 oz)  BMI 34.93 kg/m2  SpO2 100%  Intake/Output Summary (Last 24 hours) at 09/09/15 0727 Last data filed at 09/09/15 0600  Gross per 24 hour  Intake 5197.68 ml  Output     16 ml  Net 5181.68 ml   Weight change: 1.7 kg (3 lb 12 oz) YPP:JKDTO and alert, intubated IZT:IWPYK, irreg,irreg Resp:decreased BS bases Abd:+ BS NTND Ext: tr edema NEURO:CNI follows commands, no asterixis   . antiseptic oral rinse  7 mL Mouth Rinse QID  . chlorhexidine gluconate (SAGE KIT)  15 mL Mouth Rinse BID  . citric acid-sodium citrate  30 mL Oral TID WC & HS  . doxycycline (VIBRAMYCIN) IV  100 mg Intravenous Q12H  . feeding supplement (VITAL HIGH PROTEIN)  1,000 mL Per Tube Q24H  . furosemide  160 mg Intravenous Q8H  . insulin aspart  0-15 Units Subcutaneous 6 times per day  . insulin glargine  8 Units Subcutaneous Daily  . ipratropium-albuterol  3 mL Nebulization QID  . mupirocin ointment  1 application Nasal BID  . pantoprazole (PROTONIX) IV  40 mg Intravenous QHS  . piperacillin-tazobactam (ZOSYN)  IV  2.25 g Intravenous Q8H  . polyethylene glycol  17 g Oral Daily   US Renal  09/07/2015  CLINICAL DATA:  Acute renal failure EXAM: RENAL / URINARY TRACT ULTRASOUND COMPLETE COMPARISON:  None. FINDINGS: Right Kidney: Length: 11 cm. Echogenicity within normal limits. No mass or hydronephrosis visualized. Left Kidney: Length: 12.2 cm. Echogenicity within normal limits. There is no hydronephrosis. A cyst in upper pole measures 5.3 x 6 cm. Cyst in lower pole measures 6.1 x 5 cm. Bladder: Urinary bladder is not visualized decompressed with Foley catheter. Bilateral pleural effusion. IMPRESSION: 1. No hydronephrosis. A cyst in upper pole of the left kidney measures 6 cm. A cyst in lower pole of the left kidney measures 6.1 cm. No renal calculi. 2. Bilateral small  pleural effusion. 3. The urinary bladder is not visualized decompressed by Foley catheter. Electronically Signed   By: Lahoma Crocker M.D.   On: 09/07/2015 15:04   Dg Chest Port 1 View  09/09/2015  CLINICAL DATA:  Acute respiratory failure. EXAM: PORTABLE CHEST 1 VIEW COMPARISON:  September 08, 2015. FINDINGS: Stable cardiomegaly. Endotracheal and nasogastric tubes are unchanged in position. Right internal jugular catheter is unchanged. No pneumothorax is noted. Stable bilateral perihilar and basilar opacities are noted concerning for pneumonia or atelectasis with associated pleural effusions. Degenerative joint disease of left glenohumeral joint is noted. IMPRESSION: Stable support apparatus. Stable bilateral lung opacities as described above. Electronically Signed   By: Marijo Conception, M.D.   On: 09/09/2015 07:13   Dg Chest Port 1 View  09/08/2015  CLINICAL DATA:  Respiratory failure. EXAM: PORTABLE CHEST 1 VIEW COMPARISON:  09/07/2015. FINDINGS: Endotracheal tube, NG tube, right IJ line in stable position. Heart size stable. Diffuse bilateral pulmonary infiltrate and or edema again noted. Slight and interim progression. No pleural effusion or pneumothorax. IMPRESSION: 1. Lines and tubes in stable position. 2. Progressive diffuse bilateral pulmonary infiltrates and/or edema. Small bilateral pleural effusions. Electronically Signed   By: Millard   On: 09/08/2015 07:24   BMET    Component Value Date/Time   NA 122* 09/09/2015 0330   NA 123* 09/09/2015 0330   K  4.0 09/09/2015 0330   K 3.9 09/09/2015 0330   CL 83* 09/09/2015 0330   CL 83* 09/09/2015 0330   CO2 22 09/09/2015 0330   CO2 23 09/09/2015 0330   GLUCOSE 210* 09/09/2015 0330   GLUCOSE 220* 09/09/2015 0330   BUN 44* 09/09/2015 0330   BUN 44* 09/09/2015 0330   CREATININE 4.37* 09/09/2015 0330   CREATININE 4.38* 09/09/2015 0330   CALCIUM 7.0* 09/09/2015 0330   CALCIUM 6.9* 09/09/2015 0330   GFRNONAA 8* 09/09/2015 0330   GFRNONAA 8*  09/09/2015 0330   GFRAA 10* 09/09/2015 0330   GFRAA 10* 09/09/2015 0330   CBC    Component Value Date/Time   WBC 19.1* 09/09/2015 0330   RBC 3.71* 09/09/2015 0330   HGB 10.5* 09/09/2015 0330   HCT 30.5* 09/09/2015 0330   PLT 205 09/09/2015 0330   MCV 82.2 09/09/2015 0330   MCH 28.3 09/09/2015 0330   MCHC 34.4 09/09/2015 0330   RDW 14.5 09/09/2015 0330   LYMPHSABS 3.1 09/04/2015 0921   MONOABS 1.3* 09/03/2015 0921   EOSABS 0.0 09/03/2015 0921   BASOSABS 0.0 09/16/2015 0921     Assessment:  1. Presumably ARF that is most likely related to ischemic ATN in face of ARB.   2. Cardiomyopathy and valvular ht ds, mod AS and severe MR 3. Met acidosis, improved 4. Hyponatremia 5. Hypocalcemia, improved 6. A fib  Plan: 1.  Note plan for NO dialysis per Dr Corrie Dandy' note  Making a little bit of urine.   2.  Cont furosemide 3. Daily labs  Alean Kromer T

## 2015-09-09 NOTE — Progress Notes (Signed)
Pt very alert. Watching golf with her nephew. Met with nephew Michail Jewels, Initial consult done. Family recognizes she is at EOL but given her level of alertness today would like to have a little more time before extubation. Will meet with pt in am and nephew again. Full note to follow. Romona Curls, ANP

## 2015-09-09 NOTE — Progress Notes (Signed)
TELEMETRY: Reviewed telemetry pt in atrial flutter with rate 140s Filed Vitals:   09/09/15 0830 09/09/15 0845 09/09/15 0900 09/09/15 0915  BP:   98/83   Pulse: 144 133 152 76  Temp:      TempSrc:      Resp: _0 Height:      Weight:      SpO2: 100% 100% 100% 100%    Intake/Output Summary (Last 24 hours) at 09/09/15 0942 Last data filed at 09/09/15 0800  Gross per 24 hour  Intake 5078.47 ml  Output     31 ml  Net 5047.47 ml   Filed Weights   09/07/15 0459 09/08/15 0357 09/09/15 0230  Weight: 86 kg (189 lb 9.5 oz) 93.5 kg (206 lb 2.1 oz) 95.2 kg (209 lb 14.1 oz)    Subjective Patient intubated but awake. Less comfortable. No chest pain.  Marland Kitchen antiseptic oral rinse  7 mL Mouth Rinse QID  . chlorhexidine gluconate (SAGE KIT)  15 mL Mouth Rinse BID  . citric acid-sodium citrate  30 mL Oral TID WC & HS  . feeding supplement (VITAL HIGH PROTEIN)  1,000 mL Per Tube Q24H  . furosemide  160 mg Intravenous Q8H  . insulin aspart  0-15 Units Subcutaneous 6 times per day  . insulin glargine  12 Units Subcutaneous Daily  . ipratropium-albuterol  3 mL Nebulization QID  . magnesium sulfate 1 - 4 g bolus IVPB  2 g Intravenous Once  . mupirocin ointment  1 application Nasal BID  . pantoprazole sodium  40 mg Per Tube Daily  . polyethylene glycol  17 g Oral Daily   . amiodarone 30 mg/hr (09/09/15 0800)  . heparin 1,000 Units/hr (09/09/15 0800)  . norepinephrine (LEVOPHED) Adult infusion Stopped (09/09/15 0902)  . phenylephrine (NEO-SYNEPHRINE) Adult infusion 180 mcg/min (09/09/15 0803)  . vasopressin (PITRESSIN) infusion - *FOR SHOCK* Stopped (09/07/15 0926)    LABS: Basic Metabolic Panel:  Recent Labs  09/08/15 0355 09/09/15 0330  NA 123* 123*  122*  K 3.9 3.9  4.0  CL 83* 83*  83*  CO2 21* 23  22  GLUCOSE 273* 220*  210*  BUN 37* 44*  44*  CREATININE 3.90* 4.38*  4.37*  CALCIUM 6.8* 6.9*  7.0*  MG 1.5* 1.6*  PHOS 3.1 3.3   Liver Function  Tests:  Recent Labs  09/08/15 0355 09/09/15 0330  AST 623* 236*  ALT 1790* 1118*  ALKPHOS 68 93  BILITOT 2.2* 1.6*  PROT 4.7* 4.5*  ALBUMIN 1.7*  1.7* 1.5*  1.5*   No results for input(s): LIPASE, AMYLASE in the last 72 hours. CBC:  Recent Labs  09/08/15 0355 09/09/15 0330  WBC 19.9* 19.1*  HGB 10.9* 10.5*  HCT 32.3* 30.5*  MCV 82.4 82.2  PLT 221 205   Cardiac Enzymes: No results for input(s): CKTOTAL, CKMB, CKMBINDEX, TROPONINI in the last 72 hours. BNP: No results for input(s): PROBNP in the last 72 hours. D-Dimer: No results for input(s): DDIMER in the last 72 hours. Hemoglobin A1C: No results for input(s): HGBA1C in the last 72 hours. Fasting Lipid Panel: No results for input(s): CHOL, HDL, LDLCALC, TRIG, CHOLHDL, LDLDIRECT in the last 72 hours. Thyroid Function Tests: No results for input(s): TSH, T4TOTAL, T3FREE, THYROIDAB in the last 72 hours.  Invalid input(s): FREET3   Radiology/Studies:  US Renal  09/07/2015  CLINICAL DATA:  Acute renal failure EXAM: RENAL / URINARY TRACT ULTRASOUND COMPLETE COMPARISON:  None. FINDINGS: Right Kidney:  Length: 11 cm. Echogenicity within normal limits. No mass or hydronephrosis visualized. Left Kidney: Length: 12.2 cm. Echogenicity within normal limits. There is no hydronephrosis. A cyst in upper pole measures 5.3 x 6 cm. Cyst in lower pole measures 6.1 x 5 cm. Bladder: Urinary bladder is not visualized decompressed with Foley catheter. Bilateral pleural effusion. IMPRESSION: 1. No hydronephrosis. A cyst in upper pole of the left kidney measures 6 cm. A cyst in lower pole of the left kidney measures 6.1 cm. No renal calculi. 2. Bilateral small pleural effusion. 3. The urinary bladder is not visualized decompressed by Foley catheter. Electronically Signed   By: Lahoma Crocker M.D.   On: 09/07/2015 15:04   Dg Chest Port 1 View  09/09/2015  CLINICAL DATA:  Acute respiratory failure. EXAM: PORTABLE CHEST 1 VIEW COMPARISON:  September 08, 2015. FINDINGS: Stable cardiomegaly. Endotracheal and nasogastric tubes are unchanged in position. Right internal jugular catheter is unchanged. No pneumothorax is noted. Stable bilateral perihilar and basilar opacities are noted concerning for pneumonia or atelectasis with associated pleural effusions. Degenerative joint disease of left glenohumeral joint is noted. IMPRESSION: Stable support apparatus. Stable bilateral lung opacities as described above. Electronically Signed   By: Marijo Conception, M.D.   On: 09/09/2015 07:13   Dg Chest Port 1 View  09/08/2015  CLINICAL DATA:  Respiratory failure. EXAM: PORTABLE CHEST 1 VIEW COMPARISON:  09/07/2015. FINDINGS: Endotracheal tube, NG tube, right IJ line in stable position. Heart size stable. Diffuse bilateral pulmonary infiltrate and or edema again noted. Slight and interim progression. No pleural effusion or pneumothorax. IMPRESSION: 1. Lines and tubes in stable position. 2. Progressive diffuse bilateral pulmonary infiltrates and/or edema. Small bilateral pleural effusions. Electronically Signed   By: Marcello Moores  Register   On: 09/08/2015 07:24    PHYSICAL EXAM General: Elderly, intubated Head: Normal Neck: Negative for carotid bruits. JVD  elevated. No adenopathy Lungs: Coarse BS anteriorly. Diminished BS bilateral bases Heart: RRR, tachy,  S1 S2 with 2/6 systolic murmur LSB  Abdomen: Soft, non-tender, non-distended with normoactive bowel sounds. No hepatomegaly. No rebound/guarding. No obvious abdominal masses. Msk:  Strength and tone appears normal for age. Extremities: 2+edema.  Distal pedal pulses are 2+ and equal bilaterally. Neuro: Alert and oriented X 3. Moves all extremities spontaneously. Psych:  Responds to questions appropriately with a normal affect.  ASSESSMENT AND PLAN: 1. Atrial flutter with RVR. Amiodarone resumed to little effect.   AFlutter is chronic so not likely to respond to DCCV especially with marked LA enlargement. On IV  heparin. Not a candidate for beta blocker or diltiazem due to hypotension and shock. Increased HR reflective of decline in respiratory and renal function.  2. Acute on chronic systolic CHF. EF  35-40%. persistent  hypotension. Requiring  pressor support with Phenylephrine and Neo.  No response to lasix with minimal urine output. Renal on board. Decision made not to proceed with dialysis. Now a DNR.  3. Acute renal failure-oliguric. Renal US benign. Renal function progressively worse.   4. Shock liver   5. Acute respiratory failure with hypoxemia- on vent. Management per CCM  6. Moderate Aortic stenosis  7. Severe MR.  8. Elevated troponin. Most likely due to demand ischemia in setting of shock, tachycardia, and hypotension. Not a candidate for invasive cardiac evaluation.  9. Hyponatremia.  In absence of recovery of renal function the prognosis is very grim. Little else to add at this point. ? Potential for terminal wean per CCM.   Present on  Admission:  . Acute respiratory failure ([place]) . Respiratory acidosis . Metabolic acidosis . Dyspnea . Atrial flutter ([place]) . Demand ischemia ([place]) . Acute on chronic systolic CHF (congestive heart failure) ([place])  Signed, Peter Martinique, [place] 09/09/2015 9:42 AM

## 2015-09-09 NOTE — Progress Notes (Signed)
PULMONARY / CRITICAL CARE MEDICINE   Name: Mikael Sprayancy Dicke MRN: [id number] DOB: 06/06/1927    ADMISSION DATE:  09/07/2015 CONSULTATION DATE:  09/23/2015  REFERRING MD:  ED  CHIEF COMPLAINT:  Resp distress.   Brief History- Pt transferred from [place], [place]. Initial presentation- dyspnea, EMS called, patient hypoxic, combative on bipap enroute and intubated in the ED.   SUBJECTIVE:  Responding to questions. Sometimes mouths words around tube. Awake. Son present.  VITAL SIGNS: BP 82/59 mmHg  Pulse 142  Temp(Src) 98.7 F (37.1 C) (Oral)  Resp 30  Ht 5\' 5"  (1.651 m)  Wt 209 lb 14.1 oz (95.2 kg)  BMI 34.93 kg/m2  SpO2 100%  HEMODYNAMICS: CVP:  [9 mmHg-10 mmHg] 9 mmHg  VENTILATOR SETTINGS: Vent Mode:  [-] PRVC FiO2 (%):  [40 %] 40 % Set Rate:  [30 bmp] 30 bmp Vt Set:  [420 mL] 420 mL PEEP:  [5 cmH20] 5 cmH20 Plateau Pressure:  [16 cmH20-28 cmH20] 27 cmH20  INTAKE / OUTPUT: I/O last 3 completed shifts: In: 7454.5 [I.V.:4508; Other:10; NG/GT:1472.5; IV Piggyback:1464] Out: 16 [Urine:16]  PHYSICAL EXAMINATION: General:  PERLA. ET tube in place, awake, following commands, RASS-0 Neuro:  CN grossly intact, moving extremities HEENT: ET tube in place.  Cardiovascular:  Tachycardic. No murmurs. Lungs:  Equal breath sounds bilat- ant auscultation, on VEnt, no wheezes or crackles appreciates, ant auscultation Abdomen:  Full, not tender Musculoskeletal:  Warm, no edema. Skin:  Warm, moving exrtremities  LABS:  BMET  Recent Labs Lab 09/07/15 0404 09/08/15 0355 09/09/15 0330  NA 127* 123* 123*  122*  K 4.7 3.9 3.9  4.0  CL 86* 83* 83*  83*  CO2 18* 21* 23  22  BUN 30* 37* 44*  44*  CREATININE 3.50* 3.90* 4.38*  4.37*  GLUCOSE 199* 273* 220*  210*    Electrolytes  Recent Labs Lab 09/07/15 0404 09/08/15 0355 09/09/15 0330  CALCIUM 6.5* 6.8* 6.9*  7.0*  MG 1.7 1.5* 1.6*  PHOS 4.3 3.1 3.3   CBC  Recent Labs Lab 09/07/15 0810 09/08/15 0355  09/09/15 0330  WBC 18.9* 19.9* 19.1*  HGB 11.6* 10.9* 10.5*  HCT 32.7* 32.3* 30.5*  PLT 225 221 205    Coag's  Recent Labs Lab 02-07-16 0921  09/08/15 0355 09/08/15 0829 09/08/15 2200 09/09/15 0330  APTT 32  < >  --  62* 45* 49*  INR 1.73*  --  1.80*  --   --   --   < > = values in this interval not displayed.  Sepsis Markers  Recent Labs Lab 02-07-16 0921 02-07-16 1603 09/06/15 0406 09/06/15 0909 09/07/15 0404 09/08/15 1027  LATICACIDVEN 7.1* 7.7*  --  7.4*  --  2.9*  PROCALCITON 0.50  --  11.59  --  23.24  --    ABG  Recent Labs Lab 02-07-16 1717 02-07-16 2103 09/06/15 0855  PHART 7.117* 7.252* 7.464*  PCO2ART 53.7* 29.3* 30.7*  PO2ART 66.0* 66.0* 95.0    Liver Enzymes  Recent Labs Lab 09/07/15 0404 09/08/15 0355 09/09/15 0330  AST 2087* 623* 236*  ALT 2759* 1790* 1118*  ALKPHOS 77 68 93  BILITOT 1.8* 2.2* 1.6*  ALBUMIN 2.1*  1.9* 1.7*  1.7* 1.5*  1.5*   Cardiac Enzymes  Recent Labs Lab 02-07-16 1440 02-07-16 2309 09/06/15 0908  TROPONINI 2.28* 3.10* 4.18*   Glucose  Recent Labs Lab 09/08/15 0750 09/08/15 1118 09/08/15 1539 09/08/15 1920 09/08/15 2324 09/09/15 0325  GLUCAP 224* 213* 197* 193* 202*  205*   Imaging Dg Chest Port 1 View  09/09/2015  CLINICAL DATA:  Acute respiratory failure. EXAM: PORTABLE CHEST 1 VIEW COMPARISON:  September 08, 2015. FINDINGS: Stable cardiomegaly. Endotracheal and nasogastric tubes are unchanged in position. Right internal jugular catheter is unchanged. No pneumothorax is noted. Stable bilateral perihilar and basilar opacities are noted concerning for pneumonia or atelectasis with associated pleural effusions. Degenerative joint disease of left glenohumeral joint is noted. IMPRESSION: Stable support apparatus. Stable bilateral lung opacities as described above. Electronically Signed   By: Lupita Raider, M.D.   On: 09/09/2015 07:13   STUDIES:  4/3  Echo > EF- 35-40%, diffuse hypokinesis, severe mitral  regurg, moderately reduced systolic function.   CULTURES: MRSA 4/3 (+) Trache 4/3 >> Final- few yeast- Candida Blood 4/3 > still no growth 3 days>>  ANTIBIOTICS: Zosyn 4/3 >4/7 Vanc 4/3 > 4/7 Doxycy 4/4 >4/7  SIGNIFICANT EVENTS: 4/3 admitted for resp failure. Intubated.  4/4 had hypotension post amiod bolus > had epi pushes and inc pressors.  4/4 D/c amiod rates still the same, D/c Epi drip, nephrology consulted 4/6 Worsening tachycardia, Amiodarone bolus and drip restarted, Pt made DNR, comfort, no HD  LINES/TUBES: R IJ 4/3 >   DISCUSSION: 56 female, admitted for dyspnea, intubated for hypoxemic respiratory failure. Hypotensive. Known to have chronic A. fib but noncompliant with medicines. Chest x-ray with pulmonary edema. Possibly septic shock as well, with elevated procalcitonin. Initial lactic acidosis improving.HAs been in atria flutter since admission, rates uncontrolled, now worse.  Also increased troponin, likely demand- cards consulted and not a candidate for intervention. Still anuric, nephrology consulted. Pts family updated, pt now DNR, No HD.  ASSESSMENT / PLAN:  PULMONARY A: Acute hypoxemic respiratory failure 2/2 pulmonary edema,  Concern for pneumonia. New effusions P:   Continue on the ventilator for now.  D/c all antibiotics Needs diuresis. DNR  CARDIOVASCULAR A:  Chronic atrial fibrillation, not compliant with medications. new mild systolic heart failure likley due to severe acidosis,  atrial flutter Hypotension Prolonged Qtc Increased troponin P:  Was on eliquis, Now on heparin drip. Needs Diuresis, still anuric Increased trop likely due to demand, and reduced clearance with also AKI, ?baseline Cr.  Cards recs appreciated. Now on amiodarone, also bolused without improvement Neosyn and Levofed pressors, EKG- check QTC- 440  RENAL A:   Patient likely has CKD. AKI/CKD, worsening Cr- 1.6>>>4.3 Persistent Anion gap metabolic acidosis from  Azotemia Lactic acidosis, persistent Anuric Hypokalemia- Repleted Fluid overload P:   Nephology  recs appreciated  No HD IV lasix- increased to 160 TID  GASTROINTESTINAL A:   Transaminitis likely(initially 3000)- improving related to septic shock P:   TF- trickle. Observe LFTs ASA and tylenol- Normal. No bowel movement, Miralax, add sennakot  HEMATOLOGIC A:   On eliquis at home for afib Anemia, slowly downtrending leukoctosis P:  Observe Hb/Hct Heparin gtt  INFECTIOUS A:   Possible sepsis. Possible HCAP, Vs CAP P:   Panculture- pending Vanc/zosyn/doxy pending cultures.  Some mention of MRSA bacteremia not accurate, Per cultures here and at [place](called hospital to confirm micro results)  Pts blood culture result- Negative for growth.  ENDOCRINE A:   Hyperglycemia Improved, was on IVF with dextrose. P: SSI- Q4H. LAntus 12 u daily  NEUROLOGIC A: No issues P: RASS goal: 0- 01 Not requiring sedation for now. May need fentanyl/versed prn.  Discussion with family, pt now DNR, no HD. Family at bedside, updated. 4/7.  Inocente Salles, MD. PGY-3 IMTS [phone]

## 2015-09-09 NOTE — Progress Notes (Signed)
ANTICOAGULATION CONSULT NOTE - Follow Up Consult  Pharmacy Consult:  Heaprin Indication: atrial fibrillation   Allergies  Allergen Reactions  . Sulfa Antibiotics     Obtained from [employer] EMR system      Patient Measurements: Height: 5\' 5"  (165.1 cm) Weight: 209 lb 14.1 oz (95.2 kg) IBW/kg (Calculated) : 57 Heparin Dosing Weight: 73 kg  Vital Signs: Temp: 98.7 F (37.1 C) (04/07 0751) Temp Source: Oral (04/07 0751) BP: 116/73 mmHg (04/07 0600) Pulse Rate: 152 (04/07 0733)  Labs:  Recent Labs  09/06/15 0908  09/07/15 0404  09/07/15 0810  09/07/15 2355 09/08/15 0355 09/08/15 0828 09/08/15 0829 09/08/15 2200 09/09/15 0330  HGB  --   < >  --   --  11.6*  --   --  10.9*  --   --   --  10.5*  HCT  --   < >  --   --  32.7*  --   --  32.3*  --   --   --  30.5*  PLT  --   --   --   --  225  --   --  221  --   --   --  205  APTT  --   < >  --   < >  --   < > 59*  --   --  62* 45* 49*  LABPROT  --   --   --   --   --   --   --  20.9*  --   --   --   --   INR  --   --   --   --   --   --   --  1.80*  --   --   --   --   HEPARINUNFRC  --   --   --   < >  --   < > 0.36  --  0.38  --   --  0.21*  CREATININE 2.63*  < > 3.50*  --   --   --   --  3.90*  --   --   --  4.38*  4.37*  TROPONINI 4.18*  --   --   --   --   --   --   --   --   --   --   --   < > = values in this interval not displayed.  Estimated Creatinine Clearance: 10.5 mL/min (by C-G formula based on Cr of 4.37).     Assessment: Allison Ball continues on IV heparin for history of Afib while Eliquis in on hold.  Heparin level is therapeutic.  RN no longer sees blood from ET tube suctioning.   Goal of Therapy:  Heparin level 0.3-0.7 units/ml Monitor platelets by anticoagulation protocol: Yes     Plan:  - Increase heparin gtt slightly to 1050 units/hr - Daily HL / CBC - Monitor for s/sx of bleeding - Watch LFTs (on amiodarone)   Jabier Deese D. Laney Potashang, PharmD, BCPS Pager:  [phone] 09/09/2015, 2:03 PM

## 2015-09-09 NOTE — Progress Notes (Signed)
PT Cancellation Note  Patient Details Name: Allison Ball MRN: [id number] DOB: 01/24/1928   Cancelled Treatment:    Reason Eval/Treat Not Completed: Patient not medically ready Pt continues to have Aflutter with resting HR 140-150 bpm. Code status changed to DNR. Possibly transferring towards comfort care. Will follow up as appropriate.   [name] 09/09/2015, 9:13 AM Mylo RedShauna Arma Reining, PT, DPT [phone]

## 2015-09-09 NOTE — Progress Notes (Signed)
ANTICOAGULATION CONSULT NOTE - Follow Up Consult  Pharmacy Consult for Heparin Indication: atrial fibrillation  Allergies  Allergen Reactions  . Sulfa Antibiotics     Obtained from [employer] EMR system      Patient Measurements: Height: 5\' 5"  (165.1 cm) Weight: 209 lb 14.1 oz (95.2 kg) IBW/kg (Calculated) : 57 Heparin Dosing Weight: 72.6 kg  Vital Signs: Temp: 98.6 F (37 C) (04/07 0315) Temp Source: Oral (04/07 0315) BP: 89/60 mmHg (04/07 0300) Pulse Rate: 78 (04/07 0330)  Labs:  Recent Labs  09/06/15 0908  09/06/15 1206  09/07/15 0404  09/07/15 0810  09/07/15 2355 09/08/15 0355 09/08/15 0828 09/08/15 0829 09/08/15 2200 09/09/15 0330  HGB  --   --  13.9  --   --   --  11.6*  --   --  10.9*  --   --   --  10.5*  HCT  --   --  41.0  --   --   --  32.7*  --   --  32.3*  --   --   --  30.5*  PLT  --   --   --   --   --   --  225  --   --  221  --   --   --  205  APTT  --   < >  --   < >  --   < >  --   < > 59*  --   --  62* 45* 49*  LABPROT  --   --   --   --   --   --   --   --   --  20.9*  --   --   --   --   INR  --   --   --   --   --   --   --   --   --  1.80*  --   --   --   --   HEPARINUNFRC  --   --   --   --   --   < >  --   < > 0.36  --  0.38  --   --  0.21*  CREATININE 2.63*  --  2.50*  --  3.50*  --   --   --   --  3.90*  --   --   --   --   TROPONINI 4.18*  --   --   --   --   --   --   --   --   --   --   --   --   --   < > = values in this interval not displayed.  Estimated Creatinine Clearance: 11.8 mL/min (by C-G formula based on Cr of 3.9).   Assessment: AC: 82 year old female in IV heparin (switched from Eliquis) for atrial fibrillation. Appears that heparin level and aPTT are finally correlating (apixaban effects worn off) so will utilize heparin level from now on to monitor heparin. Heparin level subtherapeutic (0.21) on 850 units/hr. RN  noted some bleeding from ET tube when suctioned. CBC stable.  Goal of Therapy:  Heparin level 0.3-0.7  units/ml Monitor platelets by anticoagulation protocol: Yes    Plan:  Increase heparin gtt to 1000 units/hr Check 8 hr HL D/c daily PTT F/u any further bleeding from ET tube  Christoper Fabianaron Terek Bee, PharmD, BCPS Clinical pharmacist, pager [phone] 09/09/2015 4:06 AM

## 2015-09-10 ENCOUNTER — Inpatient Hospital Stay (HOSPITAL_COMMUNITY): Payer: Medicare Other

## 2015-09-10 LAB — RENAL FUNCTION PANEL
ALBUMIN: 1.5 g/dL — AB (ref 3.5–5.0)
Anion gap: 19 — ABNORMAL HIGH (ref 5–15)
BUN: 57 mg/dL — ABNORMAL HIGH (ref 6–20)
CALCIUM: 7.5 mg/dL — AB (ref 8.9–10.3)
CO2: 22 mmol/L (ref 22–32)
CREATININE: 4.62 mg/dL — AB (ref 0.44–1.00)
Chloride: 84 mmol/L — ABNORMAL LOW (ref 101–111)
GFR calc Af Amer: 9 mL/min — ABNORMAL LOW (ref >60)
GFR calc non Af Amer: 8 mL/min — ABNORMAL LOW (ref >60)
GLUCOSE: 139 mg/dL — AB (ref 65–99)
PHOSPHORUS: 4.7 mg/dL — AB (ref 2.5–4.6)
Potassium: 4.4 mmol/L (ref 3.5–5.1)
SODIUM: 125 mmol/L — AB (ref 135–145)

## 2015-09-10 LAB — BLOOD GAS, ARTERIAL
ACID-BASE EXCESS: 0.3 mmol/L (ref 0.0–2.0)
Bicarbonate: 24.3 mEq/L — ABNORMAL HIGH (ref 20.0–24.0)
Drawn by: 236041
FIO2: 0.4
LHR: 30 {breaths}/min
O2 SAT: 95.3 %
PATIENT TEMPERATURE: 98.6
PEEP/CPAP: 5 cmH2O
PO2 ART: 80.3 mmHg (ref 80.0–100.0)
TCO2: 25.5 mmol/L (ref 0–100)
VT: 420 mL
pCO2 arterial: 38.5 mmHg (ref 35.0–45.0)
pH, Arterial: 7.416 (ref 7.350–7.450)

## 2015-09-10 LAB — HEPATIC FUNCTION PANEL
ALT: 801 U/L — ABNORMAL HIGH (ref 14–54)
AST: 122 U/L — ABNORMAL HIGH (ref 15–41)
Albumin: 1.5 g/dL — ABNORMAL LOW (ref 3.5–5.0)
Alkaline Phosphatase: 148 U/L — ABNORMAL HIGH (ref 38–126)
BILIRUBIN DIRECT: 0.5 mg/dL (ref 0.1–0.5)
BILIRUBIN INDIRECT: 0.9 mg/dL (ref 0.3–0.9)
BILIRUBIN TOTAL: 1.4 mg/dL — AB (ref 0.3–1.2)
Total Protein: 5.3 g/dL — ABNORMAL LOW (ref 6.5–8.1)

## 2015-09-10 LAB — GLUCOSE, CAPILLARY
GLUCOSE-CAPILLARY: 115 mg/dL — AB (ref 65–99)
Glucose-Capillary: 103 mg/dL — ABNORMAL HIGH (ref 65–99)
Glucose-Capillary: 123 mg/dL — ABNORMAL HIGH (ref 65–99)
Glucose-Capillary: 162 mg/dL — ABNORMAL HIGH (ref 65–99)
Glucose-Capillary: 127 mg/dL — ABNORMAL HIGH (ref 65–99)
Glucose-Capillary: 106 mg/dL — ABNORMAL HIGH (ref 65–99)

## 2015-09-10 LAB — CBC
HCT: 33 % — ABNORMAL LOW (ref 36.0–46.0)
Hemoglobin: 11.4 g/dL — ABNORMAL LOW (ref 12.0–15.0)
MCH: 28.1 pg (ref 26.0–34.0)
MCHC: 34.5 g/dL (ref 30.0–36.0)
MCV: 81.3 fL (ref 78.0–100.0)
PLATELETS: 172 10*3/uL (ref 150–400)
RBC: 4.06 MIL/uL (ref 3.87–5.11)
RDW: 14.7 % (ref 11.5–15.5)
WBC: 22.3 10*3/uL — ABNORMAL HIGH (ref 4.0–10.5)

## 2015-09-10 LAB — CULTURE, BLOOD (ROUTINE X 2)
CULTURE: NO GROWTH
CULTURE: NO GROWTH

## 2015-09-10 LAB — HEPARIN LEVEL (UNFRACTIONATED): HEPARIN UNFRACTIONATED: 0.32 [IU]/mL (ref 0.30–0.70)

## 2015-09-10 LAB — MAGNESIUM: MAGNESIUM: 2 mg/dL (ref 1.7–2.4)

## 2015-09-10 MED ORDER — FENTANYL CITRATE (PF) 100 MCG/2ML IJ SOLN
50.0000 ug | INTRAMUSCULAR | Status: AC
  Administered 2015-09-10: 50 ug via INTRAVENOUS

## 2015-09-10 MED ORDER — SODIUM CHLORIDE 0.9 % IV SOLN
0.0000 ug/h | INTRAVENOUS | Status: DC
  Administered 2015-09-10: 12.5 ug/h via INTRAVENOUS
  Filled 2015-09-10 (×2): qty 50

## 2015-09-10 MED ORDER — LORAZEPAM 2 MG/ML IJ SOLN
0.5000 mg | Freq: Four times a day (QID) | INTRAMUSCULAR | Status: DC | PRN
  Administered 2015-09-10 (×2): 0.5 mg via INTRAVENOUS
  Filled 2015-09-10 (×3): qty 1

## 2015-09-10 MED ORDER — HEPARIN (PORCINE) IN NACL 100-0.45 UNIT/ML-% IJ SOLN
1100.0000 [IU]/h | INTRAMUSCULAR | Status: DC
  Administered 2015-09-10: 1100 [IU]/h via INTRAVENOUS
  Filled 2015-09-10 (×3): qty 250

## 2015-09-10 MED ORDER — LIDOCAINE VISCOUS 2 % MT SOLN
15.0000 mL | OROMUCOSAL | Status: DC | PRN
  Administered 2015-09-10: 15 mL via OROMUCOSAL
  Filled 2015-09-10 (×2): qty 15

## 2015-09-10 MED ORDER — FENTANYL BOLUS VIA INFUSION
50.0000 ug | INTRAVENOUS | Status: DC | PRN
  Administered 2015-09-10: 50 ug via INTRAVENOUS
  Filled 2015-09-10: qty 50

## 2015-09-10 NOTE — Progress Notes (Addendum)
Daily Progress Note   Patient Name: Allison Ball       Date: 09/10/2015 DOB: 07/26/1927  Age: 82 y.o. MRN#: [id number] Attending Physician: Rush Landmark, MD Primary Care Physician: No primary care provider on file. Admit Date: 09/15/2015  Reason for Consultation/Follow-up: Establishing goals of care and Withdrawal of life-sustaining treatment  Subjective: Met with pt and nephew. Pt does not appear as alert this am but is answering questions, interacting with her family. Asked pt if she realized how sick she is and she indicated yes. I also asked her if she felt like her time was near, and she indicated yes. Discussed plan with Dr. Corrie Dandy, to do one-way extubation to BIPAP, then address pressors. Nephew and pt in agreement. She does have family member s coming today and does not want to extubate until she has seen her family. Goal after extubation to BIPAP if she struggles for breath to pursue comfort measures at this point. Interval Events: Now in NSR, HR down, but BP dropped and pressors increased Length of Stay: 5 days  Current Medications: Scheduled Meds:  . antiseptic oral rinse  7 mL Mouth Rinse QID  . chlorhexidine gluconate (SAGE KIT)  15 mL Mouth Rinse BID  . citric acid-sodium citrate  30 mL Oral TID WC & HS  . feeding supplement (VITAL HIGH PROTEIN)  1,000 mL Per Tube Q24H  . furosemide  160 mg Intravenous Q8H  . insulin aspart  0-15 Units Subcutaneous 6 times per day  . insulin glargine  12 Units Subcutaneous Daily  . ipratropium-albuterol  3 mL Nebulization QID  . pantoprazole sodium  40 mg Per Tube Daily  . polyethylene glycol  17 g Oral Daily  . senna-docusate  2 tablet Oral Daily    Continuous Infusions: . amiodarone 30 mg/hr (09/10/15 0605)  . heparin 1,050  Units/hr (09/09/15 2000)  . norepinephrine (LEVOPHED) Adult infusion 10 mcg/min (09/10/15 0254)  . phenylephrine (NEO-SYNEPHRINE) Adult infusion 200 mcg/min (09/10/15 0911)  . vasopressin (PITRESSIN) infusion - *FOR SHOCK* Stopped (09/07/15 0926)    PRN Meds: sodium chloride, acetaminophen, fentaNYL (SUBLIMAZE) injection, fentaNYL (SUBLIMAZE) injection  Physical Exam: Physical Exam  Constitutional: She appears well-nourished.  anasarca  HENT:  Head: Normocephalic and atraumatic.  Cardiovascular:  Now in sinus   Pulmonary/Chest:  On ventilator  Neurological:  somnolent but still answering questions yes/no but then closes her eyes  Skin: Skin is warm and dry.  Psychiatric:  Opens her eyes to voice  Nursing note and vitals reviewed.               Vital Signs: BP 120/48 mmHg  Pulse 77  Temp(Src) 98.6 F (37 C) (Oral)  Resp 30  Ht _0  (1.651 m)  Wt 98.7 kg (217 lb 9.5 oz)  BMI 36.21 kg/m2  SpO2 95% SpO2: SpO2: 95 % O2 Device: O2 Device: Ventilator O2 Flow Rate:    Intake/output summary:  Intake/Output Summary (Last 24 hours) at 09/10/15 0944 Last data filed at 09/10/15 0700  Gross per 24 hour  Intake 4292.35 ml  Output     64 ml  Net 4228.35 ml   LBM:   Baseline Weight: Weight: 75.8 kg (167 lb 1.7 oz) Most recent weight: Weight: 98.7 kg (217 lb 9.5 oz)       Palliative Assessment/Data: Flowsheet Rows        Most Recent Value   Intake Tab    Referral Department  Critical care   Unit at Time of Referral  ICU   Palliative Care Primary Diagnosis  Cardiac   Date Notified  09/09/15   Palliative Care Type  New Palliative care   Reason for referral  Clarify Goals of Care, End of Life Care Assistance   Date of Admission  09/03/2015   Date first seen by Palliative Care  09/09/15   # of days Palliative referral response time  0 Day(s)   # of days IP prior to Palliative referral  4   Clinical Assessment    Palliative Performance Scale Score  20%   Pain Max last 24  hours  Not able to report   Pain Min Last 24 hours  Not able to report   Dyspnea Max Last 24 Hours  Not able to report   Dyspnea Min Last 24 hours  Not able to report   Nausea Max Last 24 Hours  Not able to report   Nausea Min Last 24 Hours  Not able to report   Anxiety Max Last 24 Hours  Not able to report   Anxiety Min Last 24 Hours  Not able to report   Other Max Last 24 Hours  Not able to report   Psychosocial & Spiritual Assessment    Palliative Care Outcomes    Patient/Family wishes: Interventions discontinued/not started   Hemodialysis, PEG, Trach   Palliative Care follow-up planned  Yes, Facility      Additional Data Reviewed: CBC    Component Value Date/Time   WBC 22.3* 09/10/2015 0412   RBC 4.06 09/10/2015 0412   HGB 11.4* 09/10/2015 0412   HCT 33.0* 09/10/2015 0412   PLT 172 09/10/2015 0412   MCV 81.3 09/10/2015 0412   MCH 28.1 09/10/2015 0412   MCHC 34.5 09/10/2015 0412   RDW 14.7 09/10/2015 0412   LYMPHSABS 3.1 09/28/2015 0921   MONOABS 1.3* 09/26/2015 0921   EOSABS 0.0 09/25/2015 0921   BASOSABS 0.0 09/06/2015 0921    CMP     Component Value Date/Time   NA 125* 09/10/2015 0413   K 4.4 09/10/2015 0413   CL 84* 09/10/2015 0413   CO2 22 09/10/2015 0413   GLUCOSE 139* 09/10/2015 0413   BUN 57* 09/10/2015 0413   CREATININE 4.62* 09/10/2015 0413   CALCIUM 7.5* 09/10/2015 0413   PROT 5.3* 09/10/2015 5859  ALBUMIN 1.5* 09/10/2015 0413   AST 122* 09/10/2015 0412   ALT 801* 09/10/2015 0412   ALKPHOS 148* 09/10/2015 0412   BILITOT 1.4* 09/10/2015 0412   GFRNONAA 8* 09/10/2015 0413   GFRAA 9* 09/10/2015 0413       Problem List:  Patient Active Problem List   Diagnosis Date Noted  . Respiratory failure ([place])   . Palliative care encounter   . Acute on chronic systolic CHF (congestive heart failure) ([place]) 09/08/2015  . ARF (acute renal failure) ([place])   . Demand ischemia ([place]) 09/06/2015  . Aortic stenosis, moderate 09/06/2015  . Severe mitral  insufficiency 09/06/2015  . Acute respiratory failure ([place]) 09/19/2015  . Respiratory acidosis 09/27/2015  . Metabolic acidosis 07/62/2633  . Dyspnea 09/13/2015  . Atrial fibrillation ([place]) 09/09/2015  . Atrial flutter ([place]) 09/27/2015  . HTN (hypertension) 09/29/2015     Palliative Care Assessment & Plan    1.Code Status:  Limited code    Code Status Orders        Start     Ordered   09/08/15 1252  Do not attempt resuscitation (DNR)   Continuous    Question Answer Comment  In the event of cardiac or respiratory ARREST Do not call a "code blue"   In the event of cardiac or respiratory ARREST Do not perform Intubation, CPR, defibrillation or ACLS   In the event of cardiac or respiratory ARREST Use medication by any route, position, wound care, and other measures to relive pain and suffering. May use oxygen, suction and manual treatment of airway obstruction as needed for comfort.   Comments No HD. Continue current treatment. No escalation of care.      09/08/15 1252    Code Status History    Date Active Date Inactive Code Status Order ID Comments User Context   09/27/2015  8:25 AM 09/08/2015 12:52 PM Full Code [id number]  Rush Landmark, MD Inpatient       2. Goals of Care/Additional Recommendations:  Continue current treatment plan, likely thru the weekend. Plan would be to do one-way extubation to BIPAP  Continue to meet with nephew Mr. Grandville Silos as to specific time.   He is agreeable to comfort care if she begins to struggle on BIPAP  Will address pressors after extubation to BIPAP  Limitations on Scope of Treatment: No Hemodialysis, No Surgical Procedures and No Tracheostomy  Desire for further Chaplaincy support:no  3. Symptom Management:      1.Dyspnea: Recommend when extubation begins to have either fentanyl or dilaudid gtt, bolus option . Monitor for need to add basal rate  4. Palliative Prophylaxis:   Delirium Protocol, Eye Care, Frequent Pain  Assessment and Turn Reposition  5. Prognosis: < 2 weeks after extubation barring an acute event. If she stabilizes after extubation, and is able to come off pressors, maybe eleigble for in-pt hospice that would except BIPAP   6. Discharge Planning:  Unknown. May die in hospital   Care plan was discussed with Dr. Corrie Dandy  Thank you for allowing the Palliative Medicine Team to assist in the care of this patient.  1400: Called to bedside. Family stated they were ready to extubate to BIPAP but pt shook her head "no". Calculated the past 24hr usage of fentanyl which was 500 mcg. Spoke to nephew about starting low dose fentanyl gtt for better symptom mgt and he agreed . Will only start at 12.5 mcg/hr and 50 mcg q1 hr prn for pain or  dyspnea. Will also add low dose ativan. Spoke to [place] and shared I feared that extubation may never be a decision that she could make because of her fear of not being able to breath. He was in agreement. We are in a waiting mode still at this point  for worsening disease progression and/or decreased alertness until moving forward.   Time In: 0900 Time Out: 0930 Total Time 30 min Prolonged Time Billed  no         Dory Horn, NP  09/10/2015, 9:44 AM  Please contact Palliative Medicine Team phone at [phone] for questions and concerns.

## 2015-09-10 NOTE — Progress Notes (Signed)
ANTICOAGULATION CONSULT NOTE - Follow Up Consult  Pharmacy Consult:  Heaprin Indication: atrial fibrillation   Allergies  Allergen Reactions  . Sulfa Antibiotics     Obtained from [employer] EMR system      Patient Measurements: Height: 5\' 5"  (165.1 cm) Weight: 217 lb 9.5 oz (98.7 kg) IBW/kg (Calculated) : 57 Heparin Dosing Weight: 73 kg  Vital Signs: Temp: 98.6 F (37 C) (04/08 0811) Temp Source: Oral (04/08 0811) BP: 114/63 mmHg (04/08 0930) Pulse Rate: 79 (04/08 0930)  Labs:  Recent Labs  09/08/15 0355  09/08/15 0829 09/08/15 2200 09/09/15 0330 09/09/15 1130 09/10/15 0412 09/10/15 0413 09/10/15 0414  HGB 10.9*  --   --   --  10.5*  --  11.4*  --   --   HCT 32.3*  --   --   --  30.5*  --  33.0*  --   --   PLT 221  --   --   --  205  --  172  --   --   APTT  --   --  62* 45* 49*  --   --   --   --   LABPROT 20.9*  --   --   --   --   --   --   --   --   INR 1.80*  --   --   --   --   --   --   --   --   HEPARINUNFRC  --   < >  --   --  0.21* 0.33  --   --  0.32  CREATININE 3.90*  --   --   --  4.38*  4.37*  --   --  4.62*  --   < > = values in this interval not displayed.  Estimated Creatinine Clearance: 10.2 mL/min (by C-G formula based on Cr of 4.62).     Assessment: [age] YOF continues on IV heparin for history of Afib while Eliquis in on hold.  Heparin level is therapeutic; no bleeding reported.  Plan for comfort care.    Goal of Therapy:  Heparin level 0.3-0.7 units/ml Monitor platelets by anticoagulation protocol: Yes     Plan:  - Increase heparin gtt slightly to 1100 units/hr - Daily HL / CBC - Monitor for s/sx of bleeding - Watch LFTs (on amiodarone)   Kimia Finan D. Laney Potashang, PharmD, BCPS Pager:  [phone] 09/10/2015, 10:10 AM

## 2015-09-10 NOTE — Consult Note (Signed)
Consultation Note Date: 09/10/2015   Patient Name: Allison Ball  DOB: 1927/10/04  MRN: [id number]  Age / Sex: 82 y.o., female  PCP: No primary care provider on file. Referring Physician: Rush Landmark, MD  Reason for Consultation: Establishing goals of care, Terminal Care and Withdrawal of life-sustaining treatment    Clinical Assessment/Narrative: Patient is an 82 year old female admitted in atrial flutter with RVR. Amiodarone resumed with little benefit. Atrial flutter is chronic, and patient has a markedly enlarged LA . She is not a candidate for beta blockers or diltiazem secondary to hypotension and shock per cardiology increased heart rate reflective of decline in respiratory and renal function. She is currently on ventilator support, and requiring pressure support. She now has acute on chronic systolic heart failure, with an EF 35-40%, persistent hypotension. She has had no response to Lasix, minimal urine output. Nephrology is consulting. Patient and family do not want dialysis. Patient is a DO NOT RESUSCITATE her renal function is becoming progressively worse. She has made little progress in weaning. Her renal function is worsening, creatinine on 09/09/2015 was 4.38. I met with her nephew Myrtie Neither, who is her healthcare power of attorney as well as with her brother and sister-in-law last night Mr. Thompson verbalizes that he knows his and is dying and that we'll ultimately have to come off of life support. He shares that he is struggling, especially today as patient is very alert gesturing to him to give her her glasses, pointing to the TV so that they can watch the [place] golf tournament. Her brother arrived to the unit and I did share with him per gram prognosis, but once she is extubated and moving towards discontinuation of pressors but this is likely end-of-life with the potential prognosis of hours to  days. Mr. Grandville Silos is also aware of this prognosis  Contacts/Participants in Discussion: Primary Decision Maker: Sherlynn Stalls [phone]   Relationship to Patient nephew HCPOA: yes  She has 1 brother who is living who is at the bedside  SUMMARY OF RECOMMENDATIONS Continue to monitor for decline. Family struggling with extubation as long as she is so alert. Family recognizes that this is a terminal condition and that extubation and comfort measures will need to be implemented at some point I plan on speaking with the patient 09/10/15, then meeting with family as to how to proceed.  Code Status/Advance Care Planning: DNR    Code Status Orders        Start     Ordered   09/08/15 1252  Do not attempt resuscitation (DNR)   Continuous    Question Answer Comment  In the event of cardiac or respiratory ARREST Do not call a "code blue"   In the event of cardiac or respiratory ARREST Do not perform Intubation, CPR, defibrillation or ACLS   In the event of cardiac or respiratory ARREST Use medication by any route, position, wound care, and other measures to relive pain and suffering. May use oxygen, suction and manual treatment of airway obstruction as needed for comfort.   Comments No HD. Continue current treatment. No escalation of care.      09/08/15 1252    Code Status History    Date Active Date Inactive Code Status Order ID Comments User Context   09/23/2015  8:25 AM 09/08/2015 12:52 PM Full Code [id number]  Rush Landmark, MD Inpatient      Other Directives:None  Symptom Management:   Pain: Continue with orders  and management by CCM  Dyspnea: Continue with ventilation for now. Once extubation process starts will need to have opioids on board to assist with extubation. Given her renal failure I would recommend fentanyl or Dilaudid  Agitation: Patient remarkably alert and calm on the ventilator watching the golf tournament. Would recommend having her said available when  extubation process begins in order to ensure comfort  Palliative Prophylaxis:   Aspiration, Bowel Regimen, Delirium Protocol, Frequent Pain Assessment and Turn Reposition  Additional Recommendations (Limitations, Scope, Preferences):  No Chemotherapy, No Hemodialysis, No Radiation, No Surgical Procedures and No Tracheostomy   Psycho-social/Spiritual:  Support System: Adequate Desire for further Chaplaincy support:no  Prognosis: Hours - Days after terminal extubation  Discharge Planning: Once extubated anticipate hospital death. If she were to stabilize would be a candidate for inpatient hospice   Chief Complaint/ Primary Diagnoses: Present on Admission:  . Acute respiratory failure ([place]) . Respiratory acidosis . Metabolic acidosis . Dyspnea . Atrial flutter ([place]) . Demand ischemia ([place]) . Acute on chronic systolic CHF (congestive heart failure) ([place])  I have reviewed the medical record, interviewed the patient and family, and examined the patient. The following aspects are pertinent.  Past Medical History  Diagnosis Date  . Permanent atrial fibrillation [facility])    Social History   Social History  . Marital Status: Widowed    Spouse Name: N/A  . Number of Children: N/A  . Years of Education: N/A   Social History Main Topics  . Smoking status: Not on file  . Smokeless tobacco: Not on file  . Alcohol Use: Not on file  . Drug Use: Not on file  . Sexual Activity: Not on file   Other Topics Concern  . Not on file   Social History Narrative  . No narrative on file   No family history on file. Scheduled Meds: . antiseptic oral rinse  7 mL Mouth Rinse QID  . chlorhexidine gluconate (SAGE KIT)  15 mL Mouth Rinse BID  . citric acid-sodium citrate  30 mL Oral TID WC & HS  . feeding supplement (VITAL HIGH PROTEIN)  1,000 mL Per Tube Q24H  . furosemide  160 mg Intravenous Q8H  . insulin aspart  0-15 Units Subcutaneous 6 times per day  . insulin glargine  12 Units  Subcutaneous Daily  . ipratropium-albuterol  3 mL Nebulization QID  . pantoprazole sodium  40 mg Per Tube Daily  . polyethylene glycol  17 g Oral Daily  . senna-docusate  2 tablet Oral Daily   Continuous Infusions: . amiodarone 30 mg/hr (09/10/15 0605)  . heparin 1,050 Units/hr (09/09/15 2000)  . norepinephrine (LEVOPHED) Adult infusion 10 mcg/min (09/10/15 0254)  . phenylephrine (NEO-SYNEPHRINE) Adult infusion 300 mcg/min (09/10/15 0713)  . vasopressin (PITRESSIN) infusion - *FOR SHOCK* Stopped (09/07/15 0926)   PRN Meds:.sodium chloride, acetaminophen, fentaNYL (SUBLIMAZE) injection, fentaNYL (SUBLIMAZE) injection Medications Prior to Admission:  Prior to Admission medications   Medication Sig Start Date End Date Taking? Authorizing Provider  apixaban (ELIQUIS) 5 MG TABS tablet Take 5 mg by mouth 2 (two) times daily.   Yes Historical Provider, MD  atenolol (TENORMIN) 50 MG tablet Take 50 mg by mouth daily.   Yes Historical Provider, MD  diltiazem (CARDIZEM CD) 240 MG 24 hr capsule Take 240 mg by mouth daily.   Yes Historical Provider, MD  furosemide (LASIX) 20 MG tablet Take 20 mg by mouth every other day.   Yes Historical Provider, MD  levothyroxine (SYNTHROID, LEVOTHROID) 50 MCG tablet  Take 50 mcg by mouth daily before breakfast.   Yes Historical Provider, MD  valsartan-hydrochlorothiazide (DIOVAN-HCT) 160-25 MG tablet Take 1 tablet by mouth daily.   Yes Historical Provider, MD   Allergies  Allergen Reactions  . Sulfa Antibiotics     Obtained from [employer] EMR system      Review of Systems  Unable to perform ROS: Acuity of condition    Physical Exam  Constitutional: She appears well-nourished.  anasarca  Cardiovascular:  Tachy, irrg  Respiratory:  ventilated  Neurological: She is alert.  Skin: Skin is warm and dry.  Psychiatric:  Alert on vent    Vital Signs: BP 114/62 mmHg  Pulse 75  Temp(Src) 98 F (36.7 C) (Oral)  Resp 30  Ht 5' 5"  (1.651 m)  Wt 98.7 kg  (217 lb 9.5 oz)  BMI 36.21 kg/m2  SpO2 95%  SpO2: SpO2: 95 % O2 Device:SpO2: 95 % O2 Flow Rate: .   IO: Intake/output summary:  Intake/Output Summary (Last 24 hours) at 09/10/15 0998 Last data filed at 09/10/15 0700  Gross per 24 hour  Intake 4852.36 ml  Output     79 ml  Net 4773.36 ml    LBM:   Baseline Weight: Weight: 75.8 kg (167 lb 1.7 oz) Most recent weight: Weight: 98.7 kg (217 lb 9.5 oz)      Palliative Assessment/Data:  Flowsheet Rows        Most Recent Value   Intake Tab    Referral Department  Critical care   Unit at Time of Referral  ICU   Palliative Care Primary Diagnosis  Cardiac   Date Notified  09/09/15   Palliative Care Type  New Palliative care   Reason for referral  Clarify Goals of Care, End of Life Care Assistance   Date of Admission  09/29/2015   Date first seen by Palliative Care  09/09/15   # of days Palliative referral response time  0 Day(s)   # of days IP prior to Palliative referral  4   Clinical Assessment    Palliative Performance Scale Score  20%   Pain Max last 24 hours  Not able to report   Pain Min Last 24 hours  Not able to report   Dyspnea Max Last 24 Hours  Not able to report   Dyspnea Min Last 24 hours  Not able to report   Nausea Max Last 24 Hours  Not able to report   Nausea Min Last 24 Hours  Not able to report   Anxiety Max Last 24 Hours  Not able to report   Anxiety Min Last 24 Hours  Not able to report   Other Max Last 24 Hours  Not able to report   Psychosocial & Spiritual Assessment    Palliative Care Outcomes    Patient/Family wishes: Interventions discontinued/not started   Hemodialysis, PEG, Trach   Palliative Care follow-up planned  Yes, Facility      Additional Data Reviewed:  CBC:    Component Value Date/Time   WBC 22.3* 09/10/2015 0412   HGB 11.4* 09/10/2015 0412   HCT 33.0* 09/10/2015 0412   PLT 172 09/10/2015 0412   MCV 81.3 09/10/2015 0412   NEUTROABS 10.2* 10/01/2015 0921   LYMPHSABS 3.1 09/04/2015  0921   MONOABS 1.3* 09/11/2015 0921   EOSABS 0.0 09/16/2015 0921   BASOSABS 0.0 09/23/2015 0921   Comprehensive Metabolic Panel:    Component Value Date/Time   NA 125* 09/10/2015 0413   K  4.4 09/10/2015 0413   CL 84* 09/10/2015 0413   CO2 22 09/10/2015 0413   BUN 57* 09/10/2015 0413   CREATININE 4.62* 09/10/2015 0413   GLUCOSE 139* 09/10/2015 0413   CALCIUM 7.5* 09/10/2015 0413   AST 122* 09/10/2015 0412   ALT 801* 09/10/2015 0412   ALKPHOS 148* 09/10/2015 0412   BILITOT 1.4* 09/10/2015 0412   PROT 5.3* 09/10/2015 0412   ALBUMIN 1.5* 09/10/2015 0413     Time In:1630 Time Out: 1800 Time Total: 90  Greater than 50%  of this time was spent counseling and coordinating care related to the above assessment and plan.  Signed by: Dory Horn, NP  Dory Horn, NP  09/10/2015, 7:52 AM  Please contact Palliative Medicine Team phone at [phone] for questions and concerns.

## 2015-09-10 NOTE — Progress Notes (Signed)
     Pt is now comfort care. Will sign off. Call for questions    Nahser, Allison PingPhilip J, MD  09/10/2015 9:47 AM    [facility] [address],  [address] [place], [place]  [address] Pager [phone] Phone: [phone]; Fax: [phone]

## 2015-09-10 NOTE — Progress Notes (Signed)
S: Denies pain  Intubated O:BP 120/50 mmHg  Pulse 79  Temp(Src) 98 F (36.7 C) (Oral)  Resp 31  Ht _0  (1.651 m)  Wt 98.7 kg (217 lb 9.5 oz)  BMI 36.21 kg/m2  SpO2 93%  Intake/Output Summary (Last 24 hours) at 09/10/15 0720 Last data filed at 09/10/15 0600  Gross per 24 hour  Intake 4694.26 ml  Output     79 ml  Net 4615.26 ml   Weight change: 3.5 kg (7 lb 11.5 oz) Gen: opens eyes, intubated CVS: RRR Resp:decreased BS bases Abd:+ BS NTND Ext: 2+ edema NEURO:CNI follows commands, no asterixis   . antiseptic oral rinse  7 mL Mouth Rinse QID  . chlorhexidine gluconate (SAGE KIT)  15 mL Mouth Rinse BID  . citric acid-sodium citrate  30 mL Oral TID WC & HS  . feeding supplement (VITAL HIGH PROTEIN)  1,000 mL Per Tube Q24H  . furosemide  160 mg Intravenous Q8H  . insulin aspart  0-15 Units Subcutaneous 6 times per day  . insulin glargine  12 Units Subcutaneous Daily  . ipratropium-albuterol  3 mL Nebulization QID  . pantoprazole sodium  40 mg Per Tube Daily  . polyethylene glycol  17 g Oral Daily  . senna-docusate  2 tablet Oral Daily   Dg Chest Port 1 View  09/10/2015  CLINICAL DATA:  82 year old female with a history of respiratory failure. Atrial fibrillation. EXAM: PORTABLE CHEST 1 VIEW COMPARISON:  09/08/2015, 09/07/2015, 09/06/2015 FINDINGS: Cardiomediastinal silhouette unchanged in size and contour, with atherosclerotic calcifications of the aortic arch. Compare to prior chest x-ray there is improved airspace opacity in the right mid lung. Persisting left sided interstitial airspace opacity. No visualized pneumothorax. Endotracheal tube terminates approximately 2.7 cm above the carina. Gastric tube terminates out of the field of view. Right IJ approach central catheter terminates in the superior vena cava. IMPRESSION: Slight improvement in a right-sided aeration, with mixed bilateral interstitial and airspace opacities, potentially a combination of edema, atelectasis, and/  or consolidation. Unchanged support apparatus, with suitable positioning of endotracheal tube, and unchanged gastric tube and right IJ central line. Signed, Dulcy Fanny. Earleen Newport, DO [facility] Electronically Signed   By: Corrie Mckusick D.O.   On: 09/10/2015 07:10   Dg Chest Port 1 View  09/09/2015  CLINICAL DATA:  Acute respiratory failure. EXAM: PORTABLE CHEST 1 VIEW COMPARISON:  September 08, 2015. FINDINGS: Stable cardiomegaly. Endotracheal and nasogastric tubes are unchanged in position. Right internal jugular catheter is unchanged. No pneumothorax is noted. Stable bilateral perihilar and basilar opacities are noted concerning for pneumonia or atelectasis with associated pleural effusions. Degenerative joint disease of left glenohumeral joint is noted. IMPRESSION: Stable support apparatus. Stable bilateral lung opacities as described above. Electronically Signed   By: Marijo Conception, M.D.   On: 09/09/2015 07:13   BMET    Component Value Date/Time   NA 125* 09/10/2015 0413   K 4.4 09/10/2015 0413   CL 84* 09/10/2015 0413   CO2 22 09/10/2015 0413   GLUCOSE 139* 09/10/2015 0413   BUN 57* 09/10/2015 0413   CREATININE 4.62* 09/10/2015 0413   CALCIUM 7.5* 09/10/2015 0413   GFRNONAA 8* 09/10/2015 0413   GFRAA 9* 09/10/2015 0413   CBC    Component Value Date/Time   WBC 22.3* 09/10/2015 0412   RBC 4.06 09/10/2015 0412   HGB 11.4* 09/10/2015 0412   HCT 33.0* 09/10/2015 0412   PLT 172 09/10/2015 0412  MCV 81.3 09/10/2015 0412   MCH 28.1 09/10/2015 0412   MCHC 34.5 09/10/2015 0412   RDW 14.7 09/10/2015 0412   LYMPHSABS 3.1 09/29/2015 0921   MONOABS 1.3* 10/01/2015 0921   EOSABS 0.0 09/19/2015 0921   BASOSABS 0.0 09/04/2015 0921     Assessment:  1. Presumably ARF that is most likely related to ischemic ATN in face of ARB.   2. Cardiomyopathy and valvular ht ds, mod AS and severe MR 3. Met acidosis, improved 4. Hyponatremia 5.  Hypocalcemia, improved 6. A fib  Plan: 1.  Note plan for NO dialysis per Dr Corrie Dandy' note  Making a little more urine but still not a lot  2.  Cont with conservative/comfort care  Allison Ball

## 2015-09-10 NOTE — Progress Notes (Signed)
eLink Physician-Brief Progress Note Patient Name: Allison Ball DOB: 09/30/1927 MRN: [id number]   Date of Service  09/10/2015  HPI/Events of Note  Called by RT d/t patient looks uncomfortable on mechanical ventilation.   eICU Interventions  Will order an ABG.     Intervention Category Intermediate Interventions: Respiratory distress - evaluation and management  Sommer,Steven Eugene 09/10/2015, 8:14 PM

## 2015-09-10 NOTE — Progress Notes (Deleted)
CRITICAL VALUE ALERT  Critical value received:  Hgb 6.9  Date of notification:  09/10/2015  Time of notification:  06:41  Critical value read back:Yes.    Nurse who received alert:  BShepherd, RN  MD notified (1st page):  De Dios  Time of first page:  06:43  MD notified (2nd page):  Time of second page:  Responding MD:  De Dios  Time MD responded:  06:43 

## 2015-09-10 NOTE — Progress Notes (Signed)
PULMONARY / CRITICAL CARE MEDICINE   Name: Allison Ball MRN: [id number] DOB: [birth year]    ADMISSION DATE:  09/20/2015 CONSULTATION DATE:  09/04/2015  REFERRING MD:  ED  CHIEF COMPLAINT:  Resp distress.   Brief History- Pt transferred from [place], [place]. Initial presentation- dyspnea, EMS called, patient hypoxic, combative on bipap enroute and intubated in the ED.   SUBJECTIVE:  Responding to questions. Sometimes mouths words around tube. Awake.  Converted to SR this am > BP dropped > higher pressors doses  VITAL SIGNS: BP 114/62 mmHg  Pulse 75  Temp(Src) 98 F (36.7 C) (Oral)  Resp 30  Ht  (1.651 m)  Wt 217 lb 9.5 oz (98.7 kg)  BMI 36.21 kg/m2  SpO2 95%  HEMODYNAMICS:    VENTILATOR SETTINGS: Vent Mode:  [-] PRVC FiO2 (%):  [40 %] 40 % Set Rate:  [30 bmp] 30 bmp Vt Set:  [420 mL] 420 mL PEEP:  [5 cmH20] 5 cmH20 Plateau Pressure:  [26 cmH20-30 cmH20] 30 cmH20  INTAKE / OUTPUT: I/O last 3 completed shifts: In: 7412.8 [I.V.:4043.8; Other:10; WU/JW:1191; IV Piggyback:694] Out: 95 [Urine:95]  PHYSICAL EXAMINATION: General:  PERLA. ET tube in place, awake, following commands, RASS-0 Neuro:  CN grossly intact, moving extremities HEENT: ET tube in place.  Cardiovascular:  Tachycardic. No murmurs. Lungs:  Equal breath sounds bilat- ant auscultation, on VEnt, no wheezes or crackles appreciates, ant auscultation Abdomen:  Full, not tender Musculoskeletal:  Warm, Gr 3 edema Skin:  Warm, moving exrtremities  LABS:  BMET  Recent Labs Lab 09/08/15 0355 09/09/15 0330 09/10/15 0413  NA 123* 123*  122* 125*  K 3.9 3.9  4.0 4.4  CL 83* 83*  83* 84*  CO2 21* BUN 37* 44*  44* 57*  CREATININE 3.90* 4.38*  4.37* 4.62*  GLUCOSE 273* 220*  210* 139*    Electrolytes  Recent Labs Lab 09/08/15 0355 09/09/15 0330 09/10/15 0412 09/10/15 0413  CALCIUM 6.8* 6.9*  7.0*  --  7.5*  MG 1.5* 1.6* 2.0  --   PHOS 3.1 3.3  --  4.7*    CBC  Recent Labs Lab 09/08/15 0355 09/09/15 0330 09/10/15 0412  WBC 19.9* 19.1* 22.3*  HGB 10.9* 10.5* 11.4*  HCT 32.3* 30.5* 33.0*  PLT 221 205 172    Coag's  Recent Labs Lab 09/14/2015 0921  09/08/15 0355 09/08/15 0829 09/08/15 2200 09/09/15 0330  APTT 32  < >  --  62* 45* 49*  INR 1.73*  --  1.80*  --   --   --   < > = values in this interval not displayed.  Sepsis Markers  Recent Labs Lab 09/22/2015 0921 10/02/2015 1603 09/06/15 0406 09/06/15 0909 09/07/15 0404 09/08/15 1027  LATICACIDVEN 7.1* 7.7*  --  7.4*  --  2.9*  PROCALCITON 0.50  --  11.59  --  23.24  --    ABG  Recent Labs Lab 09/25/2015 1717 09/17/2015 2103 09/06/15 0855  PHART 7.117* 7.252* 7.464*  PCO2ART 53.7* 29.3* 30.7*  PO2ART 66.0* 66.0* 95.0    Liver Enzymes  Recent Labs Lab 09/09/15 0330 09/09/15 1151 09/10/15 0412 09/10/15 0413  AST 236* 186* 122*  --   ALT 1118* 1003* 801*  --   ALKPHOS 93 102 148*  --   BILITOT 1.6* 1.6* 1.4*  --   ALBUMIN 1.5*  1.5* 1.5* 1.5* 1.5*   Cardiac Enzymes  Recent Labs Lab 09/20/2015 1440 09/10/2015 2309 09/06/15 0908  TROPONINI  2.28* 3.10* 4.18*   Glucose  Recent Labs Lab 09/09/15 0746 09/09/15 1149 09/09/15 1522 09/09/15 2026 09/09/15 2336 09/10/15 0328  GLUCAP 137* 143* 141* 143* 115* 103*   Imaging Dg Chest Port 1 View  09/10/2015  CLINICAL DATA:  82 year old female with a history of respiratory failure. Atrial fibrillation. EXAM: PORTABLE CHEST 1 VIEW COMPARISON:  09/08/2015, 09/07/2015, 09/06/2015 FINDINGS: Cardiomediastinal silhouette unchanged in size and contour, with atherosclerotic calcifications of the aortic arch. Compare to prior chest x-ray there is improved airspace opacity in the right mid lung. Persisting left sided interstitial airspace opacity. No visualized pneumothorax. Endotracheal tube terminates approximately 2.7 cm above the carina. Gastric tube terminates out of the field of view. Right IJ approach central  catheter terminates in the superior vena cava. IMPRESSION: Slight improvement in a right-sided aeration, with mixed bilateral interstitial and airspace opacities, potentially a combination of edema, atelectasis, and/ or consolidation. Unchanged support apparatus, with suitable positioning of endotracheal tube, and unchanged gastric tube and right IJ central line. Signed, Yvone NeuJaime S. Loreta AveWagner, DO [facility] Electronically Signed   By: Gilmer MorJaime  Wagner D.O.   On: 09/10/2015 07:10   STUDIES:  4/3  Echo > EF- 35-40%, diffuse hypokinesis, severe mitral regurg, moderately reduced systolic function.   CULTURES: MRSA 4/3 (+) Trache 4/3 >> Final- few yeast- Candida Blood 4/3 >  (-)  ANTIBIOTICS: Zosyn 4/3 >4/7 Vanc 4/3 > 4/7 Doxycy 4/4 >4/7  SIGNIFICANT EVENTS: 4/3 admitted for resp failure. Intubated.  4/4 had hypotension post amiod bolus > had epi pushes and inc pressors.  4/4 D/c amiod rates still the same, D/c Epi drip, nephrology consulted 4/6 Worsening tachycardia, Amiodarone bolus and drip restarted, Pt made DNR, comfort, no HD  LINES/TUBES: R IJ 4/3 >   DISCUSSION: [age] female, admitted for dyspnea, intubated for hypoxemic respiratory failure. Hypotensive. Known to have chronic A. fib but noncompliant with medicines. Chest x-ray with pulmonary edema. Possibly septic shock as well, with elevated procalcitonin. Initial lactic acidosis improving.HAs been in atria flutter since admission, rates uncontrolled, now worse.  Also increased troponin, likely demand- cards consulted and not a candidate for intervention. Still anuric, nephrology consulted. Pts family updated, pt now DNR, No HD.  ASSESSMENT / PLAN:  PULMONARY A: Acute hypoxemic respiratory failure 2/2 pulmonary edema,  Concern for pna but abx dc'd on 4/6.  New effusions P:   Continue on the ventilator for now.  D/c all antibiotics > observe off abx.  Needs  diuresis. DNR  CARDIOVASCULAR A:  Chronic atrial fibrillation, not compliant with medications. new mild systolic heart failure likley due to severe acidosis, ef 25% atrial flutter Hypotension Prolonged Qtc Increased troponin/demand ischemia P:  Was on eliquis, Now on heparin drip. Remains anuric Cards recs appreciated. Cont amio drip Neosyn and Levofed pressors   RENAL A:   Patient likely has CKD. AKI/CKD, anuric Fluid overload P:   Nephology  recs appreciated  No HD, CVVH Cont lasix   GASTROINTESTINAL A:   Transaminitis likely(initially 3000)- improving related to septic shock P:   TF Observe LFTs   HEMATOLOGIC A:   On eliquis at home for afib Anemia, slowly downtrending leukoctosis P:  Observe Hb/Hct Heparin gtt  INFECTIOUS A:   Possible sepsis. Possible HCAP, Vs CAP P:   Panculture- (-) Vanc/zosyn/doxy > dc'd on 4/6. Observe for infxn.  Some mention of MRSA bacteremia not accurate, Per cultures here and at [place](called hospital to confirm micro results)   Pts blood culture result-  Negative for growth at [facility]  ENDOCRINE A:   Hyperglycemia Improved, was on IVF with dextrose. P: SSI- Q4H. LAntus 12 u daily  NEUROLOGIC A: No issues P: RASS goal: 0- 01 Not requiring sedation for now. May need fentanyl/versed prn.  Discussion with family, pt now DNR, no HD. Family at bedside, updated. 4/7. Family considering withdrawing care this weekend. Appreciate palliative care input.   Critical care time with this pt today ; 30 minutes.   Pollie Meyer, MD 09/10/2015, 8:16 AM [place] [facility] Pager [phone] After 3 pm or if no answer, call [phone]

## 2015-09-10 NOTE — Progress Notes (Signed)
PT Cancellation Note  Patient Details Name: Allison Ball MRN: [id number] DOB: 02/12/1928   Cancelled Treatment:    Reason Eval/Treat Not Completed: PT screened, no needs identified, will sign off Pt is now comfort care. Planning for possible one way extubation to BiPAP. PT will sign off for now as no longer appropriate. Please re-consult if status changes. thanks   Anahita Cua A Tayla Panozzo 09/10/2015, 12:03 PM  .23

## 2015-09-11 LAB — CBC
HCT: 31.2 % — ABNORMAL LOW (ref 36.0–46.0)
Hemoglobin: 10.9 g/dL — ABNORMAL LOW (ref 12.0–15.0)
MCH: 28.6 pg (ref 26.0–34.0)
MCHC: 34.9 g/dL (ref 30.0–36.0)
MCV: 81.9 fL (ref 78.0–100.0)
PLATELETS: 144 10*3/uL — AB (ref 150–400)
RBC: 3.81 MIL/uL — ABNORMAL LOW (ref 3.87–5.11)
RDW: 14.9 % (ref 11.5–15.5)
WBC: 24.6 10*3/uL — AB (ref 4.0–10.5)

## 2015-09-11 LAB — HEPARIN LEVEL (UNFRACTIONATED): HEPARIN UNFRACTIONATED: 0.3 [IU]/mL (ref 0.30–0.70)

## 2015-09-11 LAB — RENAL FUNCTION PANEL
ALBUMIN: 1.5 g/dL — AB (ref 3.5–5.0)
Anion gap: 19 — ABNORMAL HIGH (ref 5–15)
BUN: 74 mg/dL — AB (ref 6–20)
CALCIUM: 7.7 mg/dL — AB (ref 8.9–10.3)
CO2: 23 mmol/L (ref 22–32)
CREATININE: 4.95 mg/dL — AB (ref 0.44–1.00)
Chloride: 84 mmol/L — ABNORMAL LOW (ref 101–111)
GFR calc Af Amer: 8 mL/min — ABNORMAL LOW (ref >60)
GFR calc non Af Amer: 7 mL/min — ABNORMAL LOW (ref >60)
GLUCOSE: 174 mg/dL — AB (ref 65–99)
PHOSPHORUS: 5.9 mg/dL — AB (ref 2.5–4.6)
POTASSIUM: 4.5 mmol/L (ref 3.5–5.1)
SODIUM: 126 mmol/L — AB (ref 135–145)

## 2015-09-11 LAB — GLUCOSE, CAPILLARY
GLUCOSE-CAPILLARY: 144 mg/dL — AB (ref 65–99)
GLUCOSE-CAPILLARY: 99 mg/dL (ref 65–99)
Glucose-Capillary: 116 mg/dL — ABNORMAL HIGH (ref 65–99)
Glucose-Capillary: 148 mg/dL — ABNORMAL HIGH (ref 65–99)
Glucose-Capillary: 176 mg/dL — ABNORMAL HIGH (ref 65–99)
Glucose-Capillary: 185 mg/dL — ABNORMAL HIGH (ref 65–99)
Glucose-Capillary: 192 mg/dL — ABNORMAL HIGH (ref 65–99)

## 2015-09-11 LAB — HEPATIC FUNCTION PANEL
ALT: 496 U/L — AB (ref 14–54)
AST: 58 U/L — ABNORMAL HIGH (ref 15–41)
Albumin: 1.5 g/dL — ABNORMAL LOW (ref 3.5–5.0)
Alkaline Phosphatase: 186 U/L — ABNORMAL HIGH (ref 38–126)
BILIRUBIN DIRECT: 0.5 mg/dL (ref 0.1–0.5)
BILIRUBIN INDIRECT: 0.9 mg/dL (ref 0.3–0.9)
Total Bilirubin: 1.4 mg/dL — ABNORMAL HIGH (ref 0.3–1.2)
Total Protein: 5 g/dL — ABNORMAL LOW (ref 6.5–8.1)

## 2015-09-11 MED ORDER — HEPARIN (PORCINE) IN NACL 100-0.45 UNIT/ML-% IJ SOLN
1300.0000 [IU]/h | INTRAMUSCULAR | Status: DC
  Administered 2015-09-11: 1150 [IU]/h via INTRAVENOUS
  Filled 2015-09-11 (×3): qty 250

## 2015-09-11 MED ORDER — POLYETHYLENE GLYCOL 3350 17 G PO PACK
17.0000 g | PACK | ORAL | Status: DC | PRN
  Filled 2015-09-11: qty 1

## 2015-09-11 MED ORDER — FAMOTIDINE 40 MG/5ML PO SUSR
20.0000 mg | Freq: Every day | ORAL | Status: DC
  Administered 2015-09-11 – 2015-09-12 (×2): 20 mg
  Filled 2015-09-11 (×2): qty 2.5

## 2015-09-11 MED ORDER — LORAZEPAM 2 MG/ML IJ SOLN
0.5000 mg | INTRAMUSCULAR | Status: DC | PRN
  Administered 2015-09-11: 0.5 mg via INTRAVENOUS

## 2015-09-11 MED ORDER — FENTANYL BOLUS VIA INFUSION
75.0000 ug | INTRAVENOUS | Status: DC | PRN
Start: 2015-09-11 — End: 2015-09-12
  Administered 2015-09-11 – 2015-09-12 (×5): 50 ug via INTRAVENOUS
  Filled 2015-09-11: qty 75

## 2015-09-11 MED ORDER — FAMOTIDINE 40 MG PO TABS: 40.0000 mg | ORAL_TABLET | Freq: Every day | ORAL | Status: DC

## 2015-09-11 NOTE — Progress Notes (Signed)
S: Denies pain  Intubated O:BP 103/55 mmHg  Pulse 72  Temp(Src) 98.6 F (37 C) (Oral)  Resp 29  Ht 5' 5"  (1.651 m)  Wt 103.1 kg (227 lb 4.7 oz)  BMI 37.82 kg/m2  SpO2 93%  Intake/Output Summary (Last 24 hours) at 09/11/15 0981 Last data filed at 09/11/15 0600  Gross per 24 hour  Intake 4287.27 ml  Output     48 ml  Net 4239.27 ml   Weight change: 4.4 kg (9 lb 11.2 oz) Gen: opens eyes, intubated CVS: RRR Resp:decreased BS bases Abd:+ BS NTND Ext: 2-3+ edema NEURO: Nods head to questions   . antiseptic oral rinse  7 mL Mouth Rinse QID  . chlorhexidine gluconate (SAGE KIT)  15 mL Mouth Rinse BID  . citric acid-sodium citrate  30 mL Oral TID WC & HS  . feeding supplement (VITAL HIGH PROTEIN)  1,000 mL Per Tube Q24H  . furosemide  160 mg Intravenous Q8H  . insulin aspart  0-15 Units Subcutaneous 6 times per day  . insulin glargine  12 Units Subcutaneous Daily  . pantoprazole sodium  40 mg Per Tube Daily  . polyethylene glycol  17 g Oral Daily  . senna-docusate  2 tablet Oral Daily   Dg Chest Port 1 View  09/10/2015  CLINICAL DATA:  82 year old female with a history of respiratory failure. Atrial fibrillation. EXAM: PORTABLE CHEST 1 VIEW COMPARISON:  09/08/2015, 09/07/2015, 09/06/2015 FINDINGS: Cardiomediastinal silhouette unchanged in size and contour, with atherosclerotic calcifications of the aortic arch. Compare to prior chest x-ray there is improved airspace opacity in the right mid lung. Persisting left sided interstitial airspace opacity. No visualized pneumothorax. Endotracheal tube terminates approximately 2.7 cm above the carina. Gastric tube terminates out of the field of view. Right IJ approach central catheter terminates in the superior vena cava. IMPRESSION: Slight improvement in a right-sided aeration, with mixed bilateral interstitial and airspace opacities, potentially a combination of edema, atelectasis, and/ or consolidation. Unchanged support apparatus, with  suitable positioning of endotracheal tube, and unchanged gastric tube and right IJ central line. Signed, Dulcy Fanny. Earleen Newport, DO [facility] Radiology Electronically Signed   By: Corrie Mckusick D.O.   On: 09/10/2015 07:10   BMET    Component Value Date/Time   NA 126* 09/11/2015 0440   K 4.5 09/11/2015 0440   CL 84* 09/11/2015 0440   CO2 23 09/11/2015 0440   GLUCOSE 174* 09/11/2015 0440   BUN 74* 09/11/2015 0440   CREATININE 4.95* 09/11/2015 0440   CALCIUM 7.7* 09/11/2015 0440   GFRNONAA 7* 09/11/2015 0440   GFRAA 8* 09/11/2015 0440   CBC    Component Value Date/Time   WBC 24.6* 09/11/2015 0440   RBC 3.81* 09/11/2015 0440   HGB 10.9* 09/11/2015 0440   HCT 31.2* 09/11/2015 0440   PLT 144* 09/11/2015 0440   MCV 81.9 09/11/2015 0440   MCH 28.6 09/11/2015 0440   MCHC 34.9 09/11/2015 0440   RDW 14.9 09/11/2015 0440   LYMPHSABS 3.1 09/29/2015 0921   MONOABS 1.3* 09/27/2015 0921   EOSABS 0.0 09/28/2015 0921   BASOSABS 0.0 09/15/2015 0921     Assessment:  1. Presumably ARF that is most likely related to ischemic ATN in face of ARB.   2. Cardiomyopathy and valvular ht ds, mod AS and severe MR 3. Met acidosis, improved 4. Hyponatremia 5. Hypocalcemia, improved 6. A fib  Plan: 1.  Note plan for NO dialysis.  Note plans for possible extubation  2.  Cont with conservative/comfort care  Faria Casella T

## 2015-09-11 NOTE — Progress Notes (Signed)
PULMONARY / CRITICAL CARE MEDICINE   Name: Allison Ball MRN: [id number] DOB: Jan 12, 1928    ADMISSION DATE:  09/08/15 CONSULTATION DATE:  09/08/2015  REFERRING MD:  ED  CHIEF COMPLAINT:  Resp distress.   Brief History- Pt transferred from [place], [place]. Initial presentation- dyspnea, EMS called, patient hypoxic, combative on bipap enroute and intubated in the ED.   SUBJECTIVE:  Responding to questions. Sometimes mouths words around tube. Awake.  Converted to SR this am > BP dropped > higher pressors doses  VITAL SIGNS: BP 103/55 mmHg  Pulse 72  Temp(Src) 98.6 F (37 C) (Oral)  Resp 29  Ht  (1.651 m)  Wt 227 lb 4.7 oz (103.1 kg)  BMI 37.82 kg/m2  SpO2 93%  HEMODYNAMICS:    VENTILATOR SETTINGS: Vent Mode:  [-] PRVC FiO2 (%):  [30 %-40 %] 30 % Set Rate:  [30 bmp] 30 bmp Vt Set:  [420 mL] 420 mL PEEP:  [5 cmH20] 5 cmH20 Plateau Pressure:  [30 cmH20-38 cmH20] 38 cmH20  INTAKE / OUTPUT: I/O last 3 completed shifts: In: 6726.3 [I.V.:4073.3; NG/GT:2455; IV Piggyback:198] Out: 96 [Urine:96]  PHYSICAL EXAMINATION: General:  PERLA. ET tube in place, awake, following commands, RASS-0 Neuro:  CN grossly intact, moving extremities, alert and oriented HEENT: ET tube in place.  Cardiovascular:  Tachycardic. No murmurs. Lungs:  No added sounds, on vent Musculoskeletal:  Warm, generalized anarsaca Skin:  Warm, moving exrtremities  LABS:  BMET  Recent Labs Lab 09/09/15 0330 09/10/15 0413 09/11/15 0440  NA 123*  122* 125* 126*  K 3.9  4.0 4.4 4.5  CL 83*  83* 84* 84*  CO2 BUN 44*  44* 57* 74*  CREATININE 4.38*  4.37* 4.62* 4.95*  GLUCOSE 220*  210* 139* 174*    Electrolytes  Recent Labs Lab 09/08/15 0355 09/09/15 0330 09/10/15 0412 09/10/15 0413 09/11/15 0440  CALCIUM 6.8* 6.9*  7.0*  --  7.5* 7.7*  MG 1.5* 1.6* 2.0  --   --   PHOS 3.1 3.3  --  4.7* 5.9*   CBC  Recent Labs Lab 09/09/15 0330 09/10/15 0412  09/11/15 0440  WBC 19.1* 22.3* 24.6*  HGB 10.5* 11.4* 10.9*  HCT 30.5* 33.0* 31.2*  PLT 205 172 144*    Coag's  Recent Labs Lab Sep 08, 2015 0921  09/08/15 0355 09/08/15 0829 09/08/15 2200 09/09/15 0330  APTT 32  < >  --  62* 45* 49*  INR 1.73*  --  1.80*  --   --   --   < > = values in this interval not displayed.  Sepsis Markers  Recent Labs Lab 09-08-2015 0921 Sep 08, 2015 1603 09/06/15 0406 09/06/15 0909 09/07/15 0404 09/08/15 1027  LATICACIDVEN 7.1* 7.7*  --  7.4*  --  2.9*  PROCALCITON 0.50  --  11.59  --  23.24  --    ABG  Recent Labs Lab 09/08/15 2103 09/06/15 0855 09/10/15 2015  PHART 7.252* 7.464* 7.416  PCO2ART 29.3* 30.7* 38.5  PO2ART 66.0* 95.0 80.3    Liver Enzymes  Recent Labs Lab 09/09/15 1151 09/10/15 0412 09/10/15 0413 09/11/15 0440  AST 186* 122*  --  58*  ALT 1003* 801*  --  496*  ALKPHOS 102 148*  --  186*  BILITOT 1.6* 1.4*  --  1.4*  ALBUMIN 1.5* 1.5* 1.5* 1.5*  1.5*   Cardiac Enzymes  Recent Labs Lab September 08, 2015 1440 2015-09-08 2309 09/06/15 0908  TROPONINI 2.28* 3.10* 4.18*   Glucose  Recent Labs Lab 09/10/15 0814 09/10/15 1202 09/10/15 1540 09/10/15 1942 09/10/15 2358 09/11/15 0508  GLUCAP 123* 162* 127* 106* 116* 144*   Imaging No results found. STUDIES:  4/3  Echo > EF- 35-40%, diffuse hypokinesis, severe mitral regurg, moderately reduced systolic function.   CULTURES: MRSA 4/3 (+) Trache 4/3 >> Final- few yeast- Candida Blood 4/3 >  Final - No growth.  ANTIBIOTICS: Zosyn 4/3 >4/7 Vanc 4/3 > 4/7 Doxycy 4/4 >4/7  SIGNIFICANT EVENTS: 4/3 admitted for resp failure. Intubated.  4/4 had hypotension post amiod bolus > had epi pushes and inc pressors.  4/4 D/c amiod rates still the same, D/c Epi drip, nephrology consulted 4/6 Worsening tachycardia, Amiodarone bolus and drip restarted, Pt made DNR, comfort, no HD 4/7 All antibiotics D/c, palliative consulted  LINES/TUBES: R IJ 4/3 >   DISCUSSION: [age]  female, admitted for dyspnea, intubated for hypoxemic respiratory failure. Hypotensive. Known to have chronic A. fib but noncompliant with medicines. Chest x-ray with pulmonary edema. Possibly septic shock as well, with elevated procalcitonin. Initial lactic acidosis improving.HAs been in atria flutter since admission, rates uncontrolled, now worse.  Also increased troponin, likely demand- cards consulted and not a candidate for intervention. Still anuric, nephrology consulted. Pts family updated, pt now DNR, No HD.  ASSESSMENT / PLAN:  PULMONARY A: Acute hypoxemic respiratory failure 2/2 pulmonary edema,  Concern for pna but abx dc'd on 4/7.  New effusions P:   Continue on the ventilator for now.  D/c all antibiotics > observe off abx.  Needs diuresis. DNR  CARDIOVASCULAR A:  Chronic atrial fibrillation, not compliant with medications. new mild systolic heart failure likley due to severe acidosis, ef 35% atrial flutter- uncontrolled Hypotension Prolonged Qtc Increased troponin/demand ischemia P:  Was on eliquis, Now on heparin drip. Remains anuric Cards recs appreciated. Cont amio drip, still tachycradic Neosyn and Levofed pressors  RENAL A:   Acute Kidney injury- likely ATN Patient likely has CKD.  Anuric Fluid overload Hyponatremia- due to volume overload P:   Nephology  recs appreciated  No HD, CVVH Cont lasix  Per nephrology  GASTROINTESTINAL A:   Transaminitis related to septic shock P:   TF Observe LFTs PPI Miralax and Sennakot  HEMATOLOGIC A:   On eliquis at home for afib Anemia, slowly downtrending leukocytosis persistent P:  Observe Hb/Hct Heparin gtt  INFECTIOUS A:   Possible sepsis. Possible HCAP, Vs CAP P:   Panculture- (-) Vanc/zosyn/doxy > dc'd on 4/7. Observe for infxn.  Some mention of MRSA bacteremia not accurate, Per cultures here and at [place](called hospital to confirm micro results)    ENDOCRINE A:   Hyperglycemia  Improved, was on IVF with dextrose. P: SSI- Q4H. Cont LAntus 12 u daily  NEUROLOGIC A: No issues- Still alert and oriented. P: RASS goal: 0- 01 Not requiring sedation for now. On fentanyl infusion.  Discussion with family, pt now DNR, no HD. Family at bedside, updated. 4/7. Family considering withdrawing care this weekend, terminal extubation. Appreciate palliative care input.   Inocente SallesEjiro Naviyah Schaffert, MD. PGY-3, IMTS [phone] 2054 8:06 AM 09/11/2015

## 2015-09-11 NOTE — Progress Notes (Signed)
ANTICOAGULATION CONSULT NOTE - Follow Up Consult  Pharmacy Consult:  Heaprin Indication: atrial fibrillation   Allergies  Allergen Reactions  . Sulfa Antibiotics     Obtained from [employer] EMR system      Patient Measurements: Height: 5\' 5"  (165.1 cm) Weight: 227 lb 4.7 oz (103.1 kg) IBW/kg (Calculated) : 57 Heparin Dosing Weight: 73 kg  Vital Signs: Temp: 98.5 F (36.9 C) (04/09 0803) Temp Source: Oral (04/09 0803) BP: 123/77 mmHg (04/09 0818) Pulse Rate: 77 (04/09 0818)  Labs:  Recent Labs  09/08/15 2200  09/09/15 0330 09/09/15 1130 09/10/15 0412 09/10/15 0413 09/10/15 0414 09/11/15 0440  HGB  --   < > 10.5*  --  11.4*  --   --  10.9*  HCT  --   --  30.5*  --  33.0*  --   --  31.2*  PLT  --   --  205  --  172  --   --  144*  APTT 45*  --  49*  --   --   --   --   --   HEPARINUNFRC  --   < > 0.21* 0.33  --   --  0.32 0.30  CREATININE  --   --  4.38*  4.37*  --   --  4.62*  --  4.95*  < > = values in this interval not displayed.  Estimated Creatinine Clearance: 9.7 mL/min (by C-G formula based on Cr of 4.95).     Assessment: 1984 YOF continues on IV heparin for history of Afib while Eliquis in on hold.  Heparin level is therapeutic; no bleeding reported.  Plan for comfort care.     Goal of Therapy:  Heparin level 0.3-0.7 units/ml Monitor platelets by anticoagulation protocol: Yes     Plan:  - Increase heparin gtt slightly to 1150 units/hr - Daily HL / CBC - Monitor for s/sx of bleeding - Watch LFTs (on amiodarone) - F/U hold Miralax and Senokot-S given 5 BM    Murry Khiev D. Laney Potashang, PharmD, BCPS Pager:  [phone] 09/11/2015, 9:52 AM

## 2015-09-11 NOTE — Procedures (Signed)
HD Catheter Insertion Procedure Note Allison Ball [id number] 03/20/1928  Procedure: Insertion of Central Venous Catheter Indications: pt needs HD cath  Procedure Details Consent: Risks of procedure as well as the alternatives and risks of each were explained to the (patient/caregiver).  Consent for procedure obtained. Time Out: Verified patient identification, verified procedure, site/side was marked, verified correct patient position, special equipment/implants available, medications/allergies/relevent history reviewed, required imaging and test results available.  Performed  Maximum sterile technique was used including antiseptics, cap, gloves, gown, hand hygiene, mask and sheet. Skin prep: Chlorhexidine; local anesthetic administered A antimicrobial bonded/coated triple lumen catheter was placed in the left internal jugular vein using the Seldinger technique.  Evaluation Blood flow good Complications: No apparent complications Patient did tolerate procedure well. Chest X-ray ordered to verify placement.  CXR: normal.  Performed using ultrasound guidance.  Wire visualized in vessel under ultrasound.  I personally performed this procedure.   Pt tolerated procedure well.  Pt was on fentanyl drip during procedure and receievd 2 mg of versed IV.    Jose Bridgette Habermannngelo A De Dios 09/11/2015, 3:06 PM

## 2015-09-11 NOTE — Progress Notes (Signed)
Daily Progress Note   Patient Name: Allison Ball       Date: 09/11/2015 DOB: 18-Aug-1927  Age: 82 y.o. MRN#: [id number] Attending Physician: Rush Landmark, MD Primary Care Physician: No primary care provider on file. Admit Date: 09/16/2015  Reason for Consultation/Follow-up: Establishing goals of care, Non pain symptom management, Pain control, Psychosocial/spiritual support, Terminal Care and Withdrawal of life-sustaining treatment  Subjective: Vic Blackbird, who is her healthcare power of attorney, continues to struggle with when to proceed with one way extubation as patient continues to have periods of alertness and lucidity. A fentanyl drip was started yesterday and that does seem to have helped her in terms of better control of her pain and associated agitation. She did tolerate 2 when necessary's of Ativan well. She is having more frequent periods of agitation anxiety where she is banging on the side rails and looking in distress. On third shift CCM and was notified that patient looked uncomfortable on the ventilator and an arterial blood gas was drawn but no new acute findings Interval Events: Increased agitation, worsening creatinine of Stay: 6 days  Current Medications: Scheduled Meds:  . antiseptic oral rinse  7 mL Mouth Rinse QID  . chlorhexidine gluconate (SAGE KIT)  15 mL Mouth Rinse BID  . citric acid-sodium citrate  30 mL Oral TID WC & HS  . famotidine  20 mg Per Tube Daily  . feeding supplement (VITAL HIGH PROTEIN)  1,000 mL Per Tube Q24H  . furosemide  160 mg Intravenous Q8H  . insulin aspart  0-15 Units Subcutaneous 6 times per day  . insulin glargine  12 Units Subcutaneous Daily    Continuous Infusions: . amiodarone 30 mg/hr (09/11/15 0417)  . fentaNYL infusion  INTRAVENOUS 25 mcg/hr (09/11/15 0850)  . heparin 1,150 Units/hr (09/11/15 1000)  . norepinephrine (LEVOPHED) Adult infusion 10 mcg/min (09/10/15 2152)  . phenylephrine (NEO-SYNEPHRINE) Adult infusion 75 mcg/min (09/11/15 1040)  . vasopressin (PITRESSIN) infusion - *FOR SHOCK* Stopped (09/07/15 0926)    PRN Meds: sodium chloride, acetaminophen, fentaNYL, lidocaine, LORazepam, polyethylene glycol  Physical Exam: Physical Exam  Constitutional: She appears well-developed and well-nourished.  anasarca  Cardiovascular: Normal rate and regular rhythm.   Pulmonary/Chest:  On ventilator  Genitourinary:  foley  Neurological:  Periodically alert. Can't answer simple questions by nodding her  head yes or no  Skin:  Worsening edema, anasarca  Psychiatric:  Anxious and agitated at times. She is currently calm                Vital Signs: BP 104/53 mmHg  Pulse 67  Temp(Src) 98.1 F (36.7 C) (Oral)  Resp 30  Ht 5' 5"  (1.651 m)  Wt 103.1 kg (227 lb 4.7 oz)  BMI 37.82 kg/m2  SpO2 96% SpO2: SpO2: 96 % O2 Device: O2 Device: Ventilator O2 Flow Rate:    Intake/output summary:  Intake/Output Summary (Last 24 hours) at 09/11/15 1409 Last data filed at 09/11/15 0800  Gross per 24 hour  Intake 3391.23 ml  Output     39 ml  Net 3352.23 ml   LBM: Last BM Date: 09/11/15 Baseline Weight: Weight: 75.8 kg (167 lb 1.7 oz) Most recent weight: Weight: 103.1 kg (227 lb 4.7 oz)       Palliative Assessment/Data: Flowsheet Rows        Most Recent Value   Intake Tab    Referral Department  Critical care   Unit at Time of Referral  ICU   Palliative Care Primary Diagnosis  Cardiac   Date Notified  09/09/15   Palliative Care Type  New Palliative care   Reason for referral  Clarify Goals of Care, End of Life Care Assistance   Date of Admission  09/15/2015   Date first seen by Palliative Care  09/09/15   # of days Palliative referral response time  0 Day(s)   # of days IP prior to Palliative  referral  4   Clinical Assessment    Palliative Performance Scale Score  20%   Pain Max last 24 hours  Not able to report   Pain Min Last 24 hours  Not able to report   Dyspnea Max Last 24 Hours  Not able to report   Dyspnea Min Last 24 hours  Not able to report   Nausea Max Last 24 Hours  Not able to report   Nausea Min Last 24 Hours  Not able to report   Anxiety Max Last 24 Hours  Not able to report   Anxiety Min Last 24 Hours  Not able to report   Other Max Last 24 Hours  Not able to report   Psychosocial & Spiritual Assessment    Palliative Care Outcomes    Patient/Family wishes: Interventions discontinued/not started   Hemodialysis, PEG, Trach   Palliative Care follow-up planned  Yes, Facility      Additional Data Reviewed: CBC    Component Value Date/Time   WBC 24.6* 09/11/2015 0440   RBC 3.81* 09/11/2015 0440   HGB 10.9* 09/11/2015 0440   HCT 31.2* 09/11/2015 0440   PLT 144* 09/11/2015 0440   MCV 81.9 09/11/2015 0440   MCH 28.6 09/11/2015 0440   MCHC 34.9 09/11/2015 0440   RDW 14.9 09/11/2015 0440   LYMPHSABS 3.1 09/13/2015 0921   MONOABS 1.3* 10/02/2015 0921   EOSABS 0.0 09/23/2015 0921   BASOSABS 0.0 09/04/2015 0921    CMP     Component Value Date/Time   NA 126* 09/11/2015 0440   K 4.5 09/11/2015 0440   CL 84* 09/11/2015 0440   CO2 23 09/11/2015 0440   GLUCOSE 174* 09/11/2015 0440   BUN 74* 09/11/2015 0440   CREATININE 4.95* 09/11/2015 0440   CALCIUM 7.7* 09/11/2015 0440   PROT 5.0* 09/11/2015 0440   ALBUMIN 1.5* 09/11/2015 0440   ALBUMIN 1.5* 09/11/2015  0440   AST 58* 09/11/2015 0440   ALT 496* 09/11/2015 0440   ALKPHOS 186* 09/11/2015 0440   BILITOT 1.4* 09/11/2015 0440   GFRNONAA 7* 09/11/2015 0440   GFRAA 8* 09/11/2015 0440       Problem List:  Patient Active Problem List   Diagnosis Date Noted  . Respiratory failure ([place])   . Palliative care encounter   . Acute on chronic systolic CHF (congestive heart failure) ([place]) 09/08/2015  . ARF  (acute renal failure) ([place])   . Demand ischemia ([place]) 09/06/2015  . Aortic stenosis, moderate 09/06/2015  . Severe mitral insufficiency 09/06/2015  . Acute respiratory failure ([place]) 09/13/2015  . Respiratory acidosis 09/19/2015  . Metabolic acidosis 40/34/7425  . Dyspnea 09/30/2015  . Atrial fibrillation ([place]) 09/22/2015  . Atrial flutter ([place]) 09/16/2015  . HTN (hypertension) 09/25/2015     Palliative Care Assessment & Plan    1.Code Status:  DNR    Code Status Orders        Start     Ordered   09/08/15 1252  Do not attempt resuscitation (DNR)   Continuous    Question Answer Comment  In the event of cardiac or respiratory ARREST Do not call a "code blue"   In the event of cardiac or respiratory ARREST Do not perform Intubation, CPR, defibrillation or ACLS   In the event of cardiac or respiratory ARREST Use medication by any route, position, wound care, and other measures to relive pain and suffering. May use oxygen, suction and manual treatment of airway obstruction as needed for comfort.   Comments No HD. Continue current treatment. No escalation of care.      09/08/15 1252    Code Status History    Date Active Date Inactive Code Status Order ID Comments User Context   09/18/2015  8:25 AM 09/08/2015 12:52 PM Full Code [id number]  Rush Landmark, MD Inpatient       2. Goals of Care/Additional Recommendations:  Nephew continues to struggle with when to proceed with one way extubation  Informed him that she will been on the ventilator one week as of 10/02/2015, and the decision was usually need to be made after 10 days on the ventilator.  Again he understands the severity of her illness, that she cannot survive this but the fact that she is been so alert has been very difficult for him to proceed with extubation  Patient when asked herself directly if she was ready to take the tube out would reply no  Limitations on Scope of Treatment: No Artificial Feeding, No  Hemodialysis and No Tracheostomy  Desire for further Chaplaincy support:no  Psycho-social Needs: Grief/Bereavement Support  3. Symptom Management:      1. Pain: We'll up titrate fentanyl drip to 25 g an hour with bolus dose of 75 g every 30 minutes as needed      2. Anxiety: Continue with Ativan when necessary but will shorten interval to every 4 when necessary. Monitor for need for scheduled doses  4. Palliative Prophylaxis:   Delirium Protocol and Frequent Pain Assessment  5. Prognosis: < 2 weeks  6. Discharge Planning:  Anticipate hospital death with one way extubation and/or discontinuation of pressors   Care plan was discussed with Dr. Tennis Must DIos  Thank you for allowing the Palliative Medicine Team to assist in the care of this patient.   Time In: 0830 Time Out: 0915 Total Time 45 min Prolonged Time Billed  no  Dory Horn, NP  09/11/2015, 2:09 PM  Please contact Palliative Medicine Team phone at [phone] for questions and concerns.

## 2015-09-12 DIAGNOSIS — Z515 Encounter for palliative care: Secondary | ICD-10-CM

## 2015-09-12 LAB — HEPATIC FUNCTION PANEL
ALK PHOS: 229 U/L — AB (ref 38–126)
ALT: 331 U/L — AB (ref 14–54)
AST: 43 U/L — ABNORMAL HIGH (ref 15–41)
Albumin: 1.6 g/dL — ABNORMAL LOW (ref 3.5–5.0)
BILIRUBIN DIRECT: 0.4 mg/dL (ref 0.1–0.5)
BILIRUBIN INDIRECT: 0.9 mg/dL (ref 0.3–0.9)
BILIRUBIN TOTAL: 1.3 mg/dL — AB (ref 0.3–1.2)
Total Protein: 5.1 g/dL — ABNORMAL LOW (ref 6.5–8.1)

## 2015-09-12 LAB — CBC
HEMATOCRIT: 30 % — AB (ref 36.0–46.0)
Hemoglobin: 10.4 g/dL — ABNORMAL LOW (ref 12.0–15.0)
MCH: 28.2 pg (ref 26.0–34.0)
MCHC: 34.7 g/dL (ref 30.0–36.0)
MCV: 81.3 fL (ref 78.0–100.0)
PLATELETS: 150 10*3/uL (ref 150–400)
RBC: 3.69 MIL/uL — ABNORMAL LOW (ref 3.87–5.11)
RDW: 14.8 % (ref 11.5–15.5)
WBC: 23.6 10*3/uL — ABNORMAL HIGH (ref 4.0–10.5)

## 2015-09-12 LAB — RENAL FUNCTION PANEL
Albumin: 1.5 g/dL — ABNORMAL LOW (ref 3.5–5.0)
Anion gap: 20 — ABNORMAL HIGH (ref 5–15)
BUN: 88 mg/dL — AB (ref 6–20)
CHLORIDE: 85 mmol/L — AB (ref 101–111)
CO2: 24 mmol/L (ref 22–32)
CREATININE: 5.37 mg/dL — AB (ref 0.44–1.00)
Calcium: 7.9 mg/dL — ABNORMAL LOW (ref 8.9–10.3)
GFR calc Af Amer: 8 mL/min — ABNORMAL LOW (ref >60)
GFR, EST NON AFRICAN AMERICAN: 6 mL/min — AB (ref >60)
GLUCOSE: 132 mg/dL — AB (ref 65–99)
POTASSIUM: 4.4 mmol/L (ref 3.5–5.1)
Phosphorus: 6.4 mg/dL — ABNORMAL HIGH (ref 2.5–4.6)
Sodium: 129 mmol/L — ABNORMAL LOW (ref 135–145)

## 2015-09-12 LAB — GLUCOSE, CAPILLARY
GLUCOSE-CAPILLARY: 116 mg/dL — AB (ref 65–99)
GLUCOSE-CAPILLARY: 124 mg/dL — AB (ref 65–99)
GLUCOSE-CAPILLARY: 129 mg/dL — AB (ref 65–99)
Glucose-Capillary: 141 mg/dL — ABNORMAL HIGH (ref 65–99)

## 2015-09-12 LAB — HEPARIN LEVEL (UNFRACTIONATED): HEPARIN UNFRACTIONATED: 0.22 [IU]/mL — AB (ref 0.30–0.70)

## 2015-09-12 MED ORDER — GLYCOPYRROLATE 0.2 MG/ML IJ SOLN
0.4000 mg | Freq: Once | INTRAMUSCULAR | Status: DC
  Filled 2015-09-12: qty 2

## 2015-09-12 MED ORDER — LORAZEPAM 2 MG/ML IJ SOLN
1.0000 mg | Freq: Once | INTRAMUSCULAR | Status: AC
Start: 2015-09-12 — End: 2015-09-12
  Administered 2015-09-12: 1 mg via INTRAVENOUS
  Filled 2015-09-12: qty 1

## 2015-09-12 MED ORDER — FENTANYL CITRATE (PF) 100 MCG/2ML IJ SOLN
100.0000 ug | Freq: Once | INTRAMUSCULAR | Status: AC
Start: 2015-09-12 — End: 2015-09-12
  Administered 2015-09-12: 100 ug via INTRAVENOUS

## 2015-09-12 MED ORDER — GLYCOPYRROLATE 0.2 MG/ML IJ SOLN
0.4000 mg | Freq: Once | INTRAMUSCULAR | Status: AC
  Administered 2015-09-12: 0.4 mg via INTRAVENOUS
  Filled 2015-09-12: qty 2

## 2015-09-12 MED ORDER — LORAZEPAM 2 MG/ML IJ SOLN
1.0000 mg | Freq: Once | INTRAMUSCULAR | Status: AC
  Administered 2015-09-12: 1 mg via INTRAVENOUS
  Filled 2015-09-12: qty 1

## 2015-09-12 MED ORDER — LORAZEPAM 2 MG/ML IJ SOLN: 1.0000 mg | INTRAMUSCULAR | Status: DC

## 2015-09-12 MED ORDER — LORAZEPAM 2 MG/ML IJ SOLN
1.0000 mg | INTRAMUSCULAR | Status: DC | PRN
  Filled 2015-09-12: qty 1

## 2015-09-19 ENCOUNTER — Telehealth: Payer: Self-pay

## 2015-09-19 NOTE — Telephone Encounter (Signed)
On 09/19/2015 I received a death certificate from [employer] ([facility]). The death certificate is for burial. The patient is a patient of Doctor Byrum. The death certificate will be taken to Pulmonary Unit @ [facility] this am for signature. On 09/20/2015 I received the death certificate back from Doctor Byrum. I got the death certificate ready and called the funeral home to let them know [facility].

## 2015-10-03 NOTE — Progress Notes (Signed)
ANTICOAGULATION CONSULT NOTE - Follow Up Consult  Pharmacy Consult:  Heparin Indication: atrial fibrillation   Allergies  Allergen Reactions  . Sulfa Antibiotics     Obtained from [employer] EMR system      Patient Measurements: Height: 5\' 5"  (165.1 cm) Weight: 233 lb 4 oz (105.8 kg) IBW/kg (Calculated) : 57 Heparin Dosing Weight: 73 kg  Vital Signs: Temp: 98.5 F (36.9 C) (04/09 2348) Temp Source: Oral (04/09 2348) BP: 110/57 mmHg (04/10 0300) Pulse Rate: 64 (04/10 0300)  Labs:  Recent Labs  09/10/15 0412 09/10/15 0413 09/10/15 0414 09/11/15 0440 2015/12/08 0349 2015/12/08 0350  HGB 11.4*  --   --  10.9* 10.4*  --   HCT 33.0*  --   --  31.2* 30.0*  --   PLT 172  --   --  144* 150  --   HEPARINUNFRC  --   --  0.32 0.30  --  0.22*  CREATININE  --  4.62*  --  4.95*  --   --     Estimated Creatinine Clearance: 9.9 mL/min (by C-G formula based on Cr of 4.95).     Assessment: [age] YOF continues on IV heparin for history of Afib while Eliquis on hold.  Heparin level now down to subtherapeutic. No issues with line or bleeding reported per RN. CBC stable. Plan for comfort care.   Goal of Therapy:  Heparin level 0.3-0.7 units/ml Monitor platelets by anticoagulation protocol: Yes     Plan:  - Increase heparin gtt slightly to 1300 units/hr - F/u 8 hr heparin level or transition to comfort care  Christoper Fabianaron Remington Skalsky, PharmD, BCPS Clinical pharmacist, pager [phone] 2015-06-23, 4:16 AM

## 2015-10-03 NOTE — Progress Notes (Signed)
Subjective:  No real changes- no UOP- numbers worsening- plan for comfort care - note saying a HD cath was placed was written on the wrong pt Objective Vital signs in last 24 hours: Filed Vitals:   Sep 27, 2015 0503 2015-09-27 0530 09/27/2015 0600 09/27/15 0630  BP: 111/51 106/48 106/50 103/49  Pulse: 60 57 58 58  Temp:      TempSrc:      Resp: 28 30 30 30   Height:      Weight:      SpO2: 96% 96% 97% 96%   Weight change: 2.7 kg (5 lb 15.2 oz)  Intake/Output Summary (Last 24 hours) at September 27, 2015 0643 Last data filed at 2015/09/27 0600  Gross per 24 hour  Intake 3897.33 ml  Output     50 ml  Net 3847.33 ml    Assessment/ Plan: Pt is a 82 y.o. yo female who was admitted on 09/04/2015 with resp failure- A on CRF  Assessment/Plan: 1. Renal- Not a dialysis candidate due to comorbids and would not change her clinical course- has been decided for no dialysis - plan is for one way extubation and comfort care.  I would stop getting labs.  Renal will sign off  Blakelyn Dinges A    Labs: Basic Metabolic Panel:  Recent Labs Lab 09/10/15 0413 09/11/15 0440 Sep 27, 2015 0500  NA 125* 126* 129*  K 4.4 4.5 4.4  CL 84* 84* 85*  CO2 22 23 24   GLUCOSE 139* 174* 132*  BUN 57* 74* 88*  CREATININE 4.62* 4.95* 5.37*  CALCIUM 7.5* 7.7* 7.9*  PHOS 4.7* 5.9* 6.4*   Liver Function Tests:  Recent Labs Lab 09/09/15 1151 09/10/15 0412 09/10/15 0413 09/11/15 0440 September 27, 2015 0500  AST 186* 122*  --  58*  --   ALT 1003* 801*  --  496*  --   ALKPHOS 102 148*  --  186*  --   BILITOT 1.6* 1.4*  --  1.4*  --   PROT 5.0* 5.3*  --  5.0*  --   ALBUMIN 1.5* 1.5* 1.5* 1.5*  1.5* 1.5*   No results for input(s): LIPASE, AMYLASE in the last 168 hours. No results for input(s): AMMONIA in the last 168 hours. CBC:  Recent Labs Lab 09/06/2015 0921  09/08/15 0355 09/09/15 0330 09/10/15 0412 09/11/15 0440 September 27, 2015 0349  WBC 14.6*  < > 19.9* 19.1* 22.3* 24.6* 23.6*  NEUTROABS 10.2*  --   --   --   --   --    --   HGB 13.3  < > 10.9* 10.5* 11.4* 10.9* 10.4*  HCT 42.6  < > 32.3* 30.5* 33.0* 31.2* 30.0*  MCV 94.0  < > 82.4 82.2 81.3 81.9 81.3  PLT 237  < > 221 205 172 144* 150  < > = values in this interval not displayed. Cardiac Enzymes:  Recent Labs Lab 09/09/2015 0921 09/22/2015 1440 09/15/2015 2309 09/06/15 0908  TROPONINI 0.87* 2.28* 3.10* 4.18*   CBG:  Recent Labs Lab 09/11/15 1626 09/11/15 2012 09/11/15 2347 09-27-15 0407 09-27-2015 0425  GLUCAP 176* 148* 124* 129* 116*    Iron Studies: No results for input(s): IRON, TIBC, TRANSFERRIN, FERRITIN in the last 72 hours. Studies/Results: No results found. Medications: Infusions: . amiodarone 30 mg/hr (2015/09/27 0446)  . fentaNYL infusion INTRAVENOUS 50 mcg/hr (09/11/15 2245)  . heparin 1,300 Units/hr (09/27/2015 0456)  . norepinephrine (LEVOPHED) Adult infusion 6 mcg/min (09/27/2015 0446)  . phenylephrine (NEO-SYNEPHRINE) Adult infusion 100 mcg/min (27-Sep-2015 0447)  . vasopressin (PITRESSIN) infusion - *  FOR SHOCK* Stopped (09/07/15 1658)    Scheduled Medications: . antiseptic oral rinse  7 mL Mouth Rinse QID  . chlorhexidine gluconate (SAGE KIT)  15 mL Mouth Rinse BID  . citric acid-sodium citrate  30 mL Oral TID WC & HS  . famotidine  20 mg Per Tube Daily  . feeding supplement (VITAL HIGH PROTEIN)  1,000 mL Per Tube Q24H  . furosemide  160 mg Intravenous Q8H  . insulin aspart  0-15 Units Subcutaneous 6 times per day  . insulin glargine  12 Units Subcutaneous Daily    have reviewed scheduled and prn medications.  Physical Exam: General: volume overloaded- apparently alert- did not wake Heart: RRR Lungs: CBS bilat Abdomen: tight Extremities: edema    2015/09/27,6:43 AM  LOS: 7 days

## 2015-10-03 NOTE — Progress Notes (Signed)
PULMONARY / CRITICAL CARE MEDICINE   Name: Allison Ball MRN: [id number] DOB: April 17, 1928    ADMISSION DATE:  09/30/2015 CONSULTATION DATE:  September 30, 2015  REFERRING MD:  ED  CHIEF COMPLAINT:  Resp distress.   Brief History- Pt transferred from [place], [place]. Initial presentation- dyspnea, EMS called, patient hypoxic, combative on bipap enroute and intubated in the ED.   SUBJECTIVE:  Responding to questions. Sometimes mouths words around tube. Awake.  Converted to SR this am > BP dropped > higher pressors doses  VITAL SIGNS: BP 111/61 mmHg  Pulse 93  Temp(Src) 98 F (36.7 C) (Oral)  Resp 15  Ht  (1.651 m)  Wt 233 lb 4 oz (105.8 kg)  BMI 38.81 kg/m2  SpO2 68%  HEMODYNAMICS: CVP:  [20 mmHg] 20 mmHg  VENTILATOR SETTINGS: Vent Mode:  [-] PRVC FiO2 (%):  [30 %] 30 % Set Rate:  [30 bmp] 30 bmp Vt Set:  [420 mL] 420 mL PEEP:  [5 cmH20] 5 cmH20 Plateau Pressure:  [14 cmH20-35 cmH20] 14 cmH20  INTAKE / OUTPUT: I/O last 3 completed shifts: In: 6029.7 [I.V.:3195.7; NG/GT:2570; IV Piggyback:264] Out: 80 [Urine:80]  PHYSICAL EXAMINATION: General:  PERLA. ET tube in place, awake, following commands, RASS-0 initially before extubation. Neuro:  CN grossly intact, moving extremities, alert and oriented HEENT: ET tube in place.  Cardiovascular:  Tachycardic. No murmurs. Lungs:  No added sounds, on vent Musculoskeletal:  Warm, generalized anarsaca Skin:  Warm, moving exrtremities  LABS:  BMET  Recent Labs Lab 09/10/15 0413 09/11/15 0440 09/25/2015 0500  NA 125* 126* 129*  K 4.4 4.5 4.4  CL 84* 84* 85*  CO2 BUN 57* 74* 88*  CREATININE 4.62* 4.95* 5.37*  GLUCOSE 139* 174* 132*    Electrolytes  Recent Labs Lab 09/08/15 0355 09/09/15 0330 09/10/15 0412 09/10/15 0413 09/11/15 0440 09/11/2015 0500  CALCIUM 6.8* 6.9*  7.0*  --  7.5* 7.7* 7.9*  MG 1.5* 1.6* 2.0  --   --   --   PHOS 3.1 3.3  --  4.7* 5.9* 6.4*   CBC  Recent Labs Lab  09/10/15 0412 09/11/15 0440 09/11/2015 0349  WBC 22.3* 24.6* 23.6*  HGB 11.4* 10.9* 10.4*  HCT 33.0* 31.2* 30.0*  PLT 172 144* 150    Coag's  Recent Labs Lab 09/08/15 0355 09/08/15 0829 09/08/15 2200 09/09/15 0330  APTT  --  62* 45* 49*  INR 1.80*  --   --   --     Sepsis Markers  Recent Labs Lab 09/30/2015 1603 09/06/15 0406 09/06/15 0909 09/07/15 0404 09/08/15 1027  LATICACIDVEN 7.7*  --  7.4*  --  2.9*  PROCALCITON  --  11.59  --  23.24  --    ABG  Recent Labs Lab 09/30/15 2103 09/06/15 0855 09/10/15 2015  PHART 7.252* 7.464* 7.416  PCO2ART 29.3* 30.7* 38.5  PO2ART 66.0* 95.0 80.3    Liver Enzymes  Recent Labs Lab 09/10/15 0412  09/11/15 0440 09/15/2015 0349 09/10/2015 0500  AST 122*  --  58* 43*  --   ALT 801*  --  496* 331*  --   ALKPHOS 148*  --  186* 229*  --   BILITOT 1.4*  --  1.4* 1.3*  --   ALBUMIN 1.5*  < > 1.5*  1.5* 1.6* 1.5*  < > = values in this interval not displayed. Cardiac Enzymes  Recent Labs Lab 09-30-2015 1440 September 30, 2015 2309 09/06/15 0908  TROPONINI 2.28* 3.10* 4.18*   Glucose  Recent Labs Lab 09/11/15 1626 09/11/15 2012 09/11/15 2347 09/03/2015 0407 09/11/2015 0425 09/30/2015 0756  GLUCAP 176* 148* 124* 129* 116* 141*   Imaging No results found. STUDIES:  4/3  Echo > EF- 35-40%, diffuse hypokinesis, severe mitral regurg, moderately reduced systolic function.   CULTURES: MRSA 4/3 (+) Trache 4/3 >> Final- few yeast- Candida Blood 4/3 >  Final - No growth.  ANTIBIOTICS: Zosyn 4/3 >4/7 Vanc 4/3 > 4/7 Doxycy 4/4 >4/7  SIGNIFICANT EVENTS: 4/3 admitted for resp failure. Intubated.  4/4 had hypotension post amiod bolus > had epi pushes and inc pressors.  4/4 D/c amiod rates still the same, D/c Epi drip, nephrology consulted 4/6 Worsening tachycardia, Amiodarone bolus and drip restarted, Pt made DNR, comfort, no HD 4/7 All antibiotics D/c, palliative consulted 4/10 Family and patient ready to go ahead with  extubation, with the understanding it would be terminal. Orders placed to extubate.  LINES/TUBES: R IJ 4/3 >   DISCUSSION: [id number] female, admitted for dyspnea, intubated for hypoxemic respiratory failure. Hypotensive. Known to have chronic A. fib but noncompliant with medicines. Chest x-ray with pulmonary edema. Possibly septic shock as well, with elevated procalcitonin. Initial lactic acidosis improving.HAs been in atria flutter since admission, rates uncontrolled, now worse.  Also increased troponin, likely demand- cards consulted and not a candidate for intervention. Still anuric, nephrology consulted. Pts family updated, pt now DNR, No HD.  ASSESSMENT / PLAN:  PULMONARY A: Acute hypoxemic respiratory failure 2/2 pulmonary edema,  Concern for pna but abx dc'd on 4/7.  New effusions P:   Pt voiced she is ready to be extubated terminally. Extubate to nasal canula D/c all antibiotics   DNR Comfort care  CARDIOVASCULAR A:  Chronic atrial fibrillation, not compliant with medications. new mild systolic heart failure likley due to severe acidosis, ef 35% atrial flutter- uncontrolled Hypotension Prolonged Qtc Increased troponin/demand ischemia P:  On heparin drip- d/c Remains anuric Cards recs appreciated- signed off 4/8 amio drip, d/c D/c Levofed pressors  RENAL A:   Acute Kidney injury- likely ATN Patient likely has CKD.  Anuric Fluid overload- ~70Lb weight gain Hyponatremia- due to volume overload P:   Nephology  recs appreciated, signe doff No HD, CVVH D/c Cont lasix  GASTROINTESTINAL A:   Transaminitis related to septic shock P:   TF Improved LFTs PPI D/c PRN Miralax and Sennakot  HEMATOLOGIC A:   On eliquis at home for afib Anemia, slowly downtrending leukocytosis persistent P:  Observe Hb/Hct Heparin gtt  INFECTIOUS A:   Possible sepsis. Possible HCAP, Vs CAP P:   Panculture- (-) Vanc/zosyn/doxy > dc'd on 4/7.  Some mention of MRSA bacteremia not  accurate, Per cultures here and at [place](called hospital to confirm micro results)    ENDOCRINE A:   Hyperglycemia Improved, was on IVF with dextrose. P:  NEUROLOGIC A: No issues- P: PRN fentanyl- D/c   Appreciate palliative care input- talked to family. Extubation Orders placed. Family and patient requested- DNR status, No HD and terminal extubation. Pt went into asystole and pronounced dead at 10.48am. Shann MedalNephew Allison Ball- POA was at bedside.  Allison SallesEjiro Nada Godley, MD. PGY-3, IMTS [phone] 2054 12:01 PM 10/02/2015

## 2015-10-03 NOTE — Progress Notes (Signed)
At 1048 the patient went asystole. A heart beat was not auscultated, there were no spontaneous respirations, and the pupils were dilated and not reactive to light. The death was pronounced by Janice Coffinyesha Claudell Wohler RN and Toniann FailLauren Cline RN. Nephew Randy POA was at the bedside. MD and NP were notified. Harvie HeckRandy declined any spiritual support.

## 2015-10-03 NOTE — Discharge Summary (Signed)
            PULMONOLOGY/CRITICAL CARE- DEATH SUMMARY   Name: Allison Ball MRN: [id number] DOB: 09/28/1927 82 y.o.  Date of Admission: 09/03/2015  6:14 AM Date of Discharge: 2016-03-09 Attending Physician: No att. providers found  Discharge Diagnosis: Active Problems:   Acute respiratory failure (HCC)   Respiratory acidosis   Metabolic acidosis   Dyspnea   Atrial fibrillation (HCC)   Atrial flutter (HCC)   HTN (hypertension)   Demand ischemia (HCC)   Aortic stenosis, moderate   Severe mitral insufficiency   ARF (acute renal failure) (HCC)   Acute on chronic systolic CHF (congestive heart failure) (HCC)   Respiratory failure (HCC)   Palliative care encounter   Terminal care   End of life care  Cause of death: Acute Respiratory failure  Time of death: 10.48am, 2016-03-09.  Disposition and follow-up:   Ms.Carle Kizzie [facility]Hughes [facility].    Hospital Course: Pt transferred from [place], [place]. Initial presentation- dyspnea, EMS called, patient was hypoxic, combative on bipap enroute and intubated in the ED. Pt was in septic Shock, with lactic acidosis of 7, ABG with Metabolic acidosis- PH- 7.1, requiring aggressive hydration and initially 4 vasopressors- Norepineprine, Neosynephrine, Vasopressin and later epineprine . Patient had a hx of atria fibrillation, not complaint with meds, but was in Atria flutter with RVR, which was unresponsive to Amiodarone Infusion. Pt was placed on broad coverage with Antibiotics- Vanc, Zosyn and Doxycycline.  Patient labs showed multi-organ failure with LFTs- 3000s, NSTEMI, marked acidosis and worsening renal function with anuria. Blood culture results- Final showed No growth. Cardiology and nephrology were consulted. Cardiology evaluation- Patient Atria Flutter unlikley to respond to DCCV, and NSTEMI likely due to demand, pt not a candidate for invasive cardiac evaluation. Echo showed an EF- 35-40%,  severe mitral stenosis with diffuse Hypokinesis, but this was in the setting of marked acidosis. Despite patient mental status improving, spontaneous conversion of Atria flutter to Sinus rhythm, improving LTFs, resolving lactic acidosis, patient kidney function failed to improve. Pt developed marked volume overload, gaining- ~70 Lbs through the course of admission. Patient and family- nephew- Harvie HeckRandy who was patients HCPOA, declined Hemodialysis and elected for DNR status. Paliative was consulted and provided support for family.  Patients respiratory status remained stable while she was intubated. Pt and family indicated they were ready for terminal extubation- May 09, 2016, Pt was then terminally extubated, and patient expired ~1hr after extubation.   Family- nephew Harvie HeckRandy was present at bedside.  Signed: Onnie BoerEjiroghene E Libbi Towner, MD  IMTS 2016-03-09, 3:24 PM

## 2015-10-03 NOTE — Procedures (Signed)
Extubation Procedure Note  Patient Details:   Name: Mikael Sprayancy Jalbert DOB: 08/21/1927 MRN: [id number]   Airway Documentation:     Evaluation  O2 sats: transiently fell during during procedure Complications: No apparent complications Patient did tolerate procedure well. Bilateral Breath Sounds: Diminished   No   Terminal extubation. Positive cuff leak noted prior to extubation. Patient placed on [place] 4Lpm with humidity. Nodded that she was more comfortable post extubation. Family present in room.  Rayburn FeltJean S Kameryn Davern 09/17/2015, 9:43 AM

## 2015-10-03 NOTE — Progress Notes (Signed)
Patient alert and interactive. Patient expressed that she is ready to be extubated by nodding. On multiple occassions, the patient attempted to self extubate. Randy, the POA, was immediately called and the RN explained that his aunt has expressed through multiple gestures that she is ready to extubate. The RN also expressed that his aunt is extremely uncomfortable and in pain and requiring frequent amounts of pain medication. Harvie HeckRandy expressed that he is ready to proceed with the extubation and that he was an hour away. He also mentioned to have every ready once he gets here. Pallative was paged to initiate the orders. Waiting for a response.

## 2015-10-03 NOTE — Progress Notes (Signed)
Daily Progress Note   Patient Name: Allison Ball       Date: 09/19/2015 DOB: [birth year]  Age: 82 y.o. MRN#: [id number] Attending Physician: Louann Sjogren, MD Primary Care Physician: No primary care provider on file. Admit Date: 09/25/15  Reason for Consultation/Follow-up: Withdrawal of life-sustaining treatment  Subjective: NA  Interval Events: Allison Ball, agrees with extubation.  He understands clearly that she will not survive and he is at peace with his decision.  Length of Stay: 7 days  Current Medications: Scheduled Meds:  . glycopyrrolate  0.4 mg Intravenous Once  . LORazepam  1 mg Intravenous Once  . LORazepam  1 mg Intravenous Q2H    Continuous Infusions: . fentaNYL infusion INTRAVENOUS 200 mcg/hr (09/25/2015 0940)    PRN Meds: fentaNYL, LORazepam   Physical Exam             Eyes open, on the Vent, nods appropriately to my questions CV regular Resp:  Vented Abdomen:  Tight/Firm Extremities:  +2-3 edema, tight   Vital Signs: BP 111/61 mmHg  Pulse 93  Temp(Src) 98 F (36.7 C) (Oral)  Resp 15  Ht  (1.651 m)  Wt 105.8 kg (233 lb 4 oz)  BMI 38.81 kg/m2  SpO2 68% SpO2: SpO2: (!) 68 % O2 Device: O2 Device: Nasal Cannula O2 Flow Rate: O2 Flow Rate (L/min): 4 L/min  Intake/output summary:   Intake/Output Summary (Last 24 hours) at 09/05/2015 1038 Last data filed at 09/24/2015 1000  Gross per 24 hour  Intake 3452.24 ml  Output     45 ml  Net 3407.24 ml   LBM: Last BM Date: 09/16/2015 Baseline Weight: Weight: 75.8 kg (167 lb 1.7 oz) Most recent weight: Weight: 105.8 kg (233 lb 4 oz)       Palliative Assessment/Data: Flowsheet Rows        Most Recent Value   Intake Tab    Referral Department  Critical care   Unit at Time of Referral  ICU   Palliative Care Primary Diagnosis  Cardiac   Date Notified  09/09/15   Palliative Care Type  New Palliative care   Reason for referral  Clarify Goals of Care, End of Life Care Assistance   Date of Admission  09-25-2015   Date first seen by Palliative Care  09/09/15   #  of days Palliative referral response time  0 Day(s)   # of days IP prior to Palliative referral  4   Clinical Assessment    Palliative Performance Scale Score  20%   Pain Max last 24 hours  Not able to report   Pain Min Last 24 hours  Not able to report   Dyspnea Max Last 24 Hours  Not able to report   Dyspnea Min Last 24 hours  Not able to report   Nausea Max Last 24 Hours  Not able to report   Nausea Min Last 24 Hours  Not able to report   Anxiety Max Last 24 Hours  Not able to report   Anxiety Min Last 24 Hours  Not able to report   Other Max Last 24 Hours  Not able to report   Psychosocial & Spiritual Assessment    Palliative Care Outcomes    Patient/Family wishes: Interventions discontinued/not started   Hemodialysis, PEG, Trach   Palliative Care follow-up planned  Yes, Facility      Additional Data Reviewed: CBC    Component Value Date/Time   WBC 23.6* 09/13/2015 0349   RBC 3.69* 09/24/2015 0349   HGB 10.4* 10/02/2015 0349   HCT 30.0* 09/15/2015 0349   PLT 150 09/11/2015 0349   MCV 81.3 10/02/2015 0349   MCH 28.2 09/10/2015 0349   MCHC 34.7 09/17/2015 0349   RDW 14.8 10/02/2015 0349   LYMPHSABS 3.1 2015-09-26 0921   MONOABS 1.3* 2015/09/26 0921   EOSABS 0.0 2015-09-26 0921   BASOSABS 0.0 09-26-15 0921    CMP     Component Value Date/Time   NA 129* 09/10/2015 0500   K 4.4 09/25/2015 0500   CL 85* 09/25/2015 0500   CO2 24 09/30/2015 0500   GLUCOSE 132* 09/16/2015 0500   BUN 88* 09/19/2015 0500   CREATININE 5.37* 09/04/2015 0500   CALCIUM 7.9* 09/13/2015 0500   PROT 5.1* 09/05/2015 0349   ALBUMIN 1.5* 09/10/2015 0500   AST 43* 09/06/2015 0349   ALT 331* 09/04/2015 0349   ALKPHOS 229*  09/10/2015 0349   BILITOT 1.3* 09/24/2015 0349   GFRNONAA 6* 10/02/2015 0500   GFRAA 8* 09/09/2015 0500       Problem List:  Patient Active Problem List   Diagnosis Date Noted  . Respiratory failure (HCC)   . Palliative care encounter   . Acute on chronic systolic CHF (congestive heart failure) (HCC) 09/08/2015  . ARF (acute renal failure) (HCC)   . Demand ischemia (HCC) 09/06/2015  . Aortic stenosis, moderate 09/06/2015  . Severe mitral insufficiency 09/06/2015  . Acute respiratory failure (HCC) 2015-09-26  . Respiratory acidosis 09-26-15  . Metabolic acidosis 09-26-2015  . Dyspnea 2015-09-26  . Atrial fibrillation (HCC) 2015-09-26  . Atrial flutter (HCC) Sep 26, 2015  . HTN (hypertension) 09/26/15     Palliative Care Assessment & Plan    1.Code Status:  DNR  2. Goals of Care/Additional Recommendations:  Extubation 4/10.  D/C pressors.  Full comfort.    Appreciate excellent nursing care that has been provided to this patient.   3. Symptom Management:      1. Fentanyl for pain and dyspnea      2. Ativan for agitation / anxiety      3. Robinul for secretions  4. Palliative Prophylaxis:   Delirium Protocol, Eye Care, Frequent Pain Assessment and Oral Care  5. Prognosis: Hours - Days  6. Discharge Planning:  Anticipated Hospital Death   Care plan was discussed  with RN, PCCM attending.  Thank you for allowing the Palliative Medicine Team to assist in the care of this patient.   Time In: 9:00 Time Out: 10:00 Total Time 60 min Prolonged Time Billed  no        Allison PoliceMarianne L York, PA-C  20-Jan-2016, 10:38 AM  Please contact Palliative Medicine Team phone at [phone] for questions and concerns.

## 2015-10-03 DEATH — deceased

## 2017-06-04 IMAGING — CR DG CHEST 1V PORT
1 series · 1 of 1 positions shown · non-contrast
Comparison: 09/05/2015 at 4041 hours

CLINICAL DATA: Central line placement.

EXAM:
PORTABLE CHEST 1 VIEW

[AP]
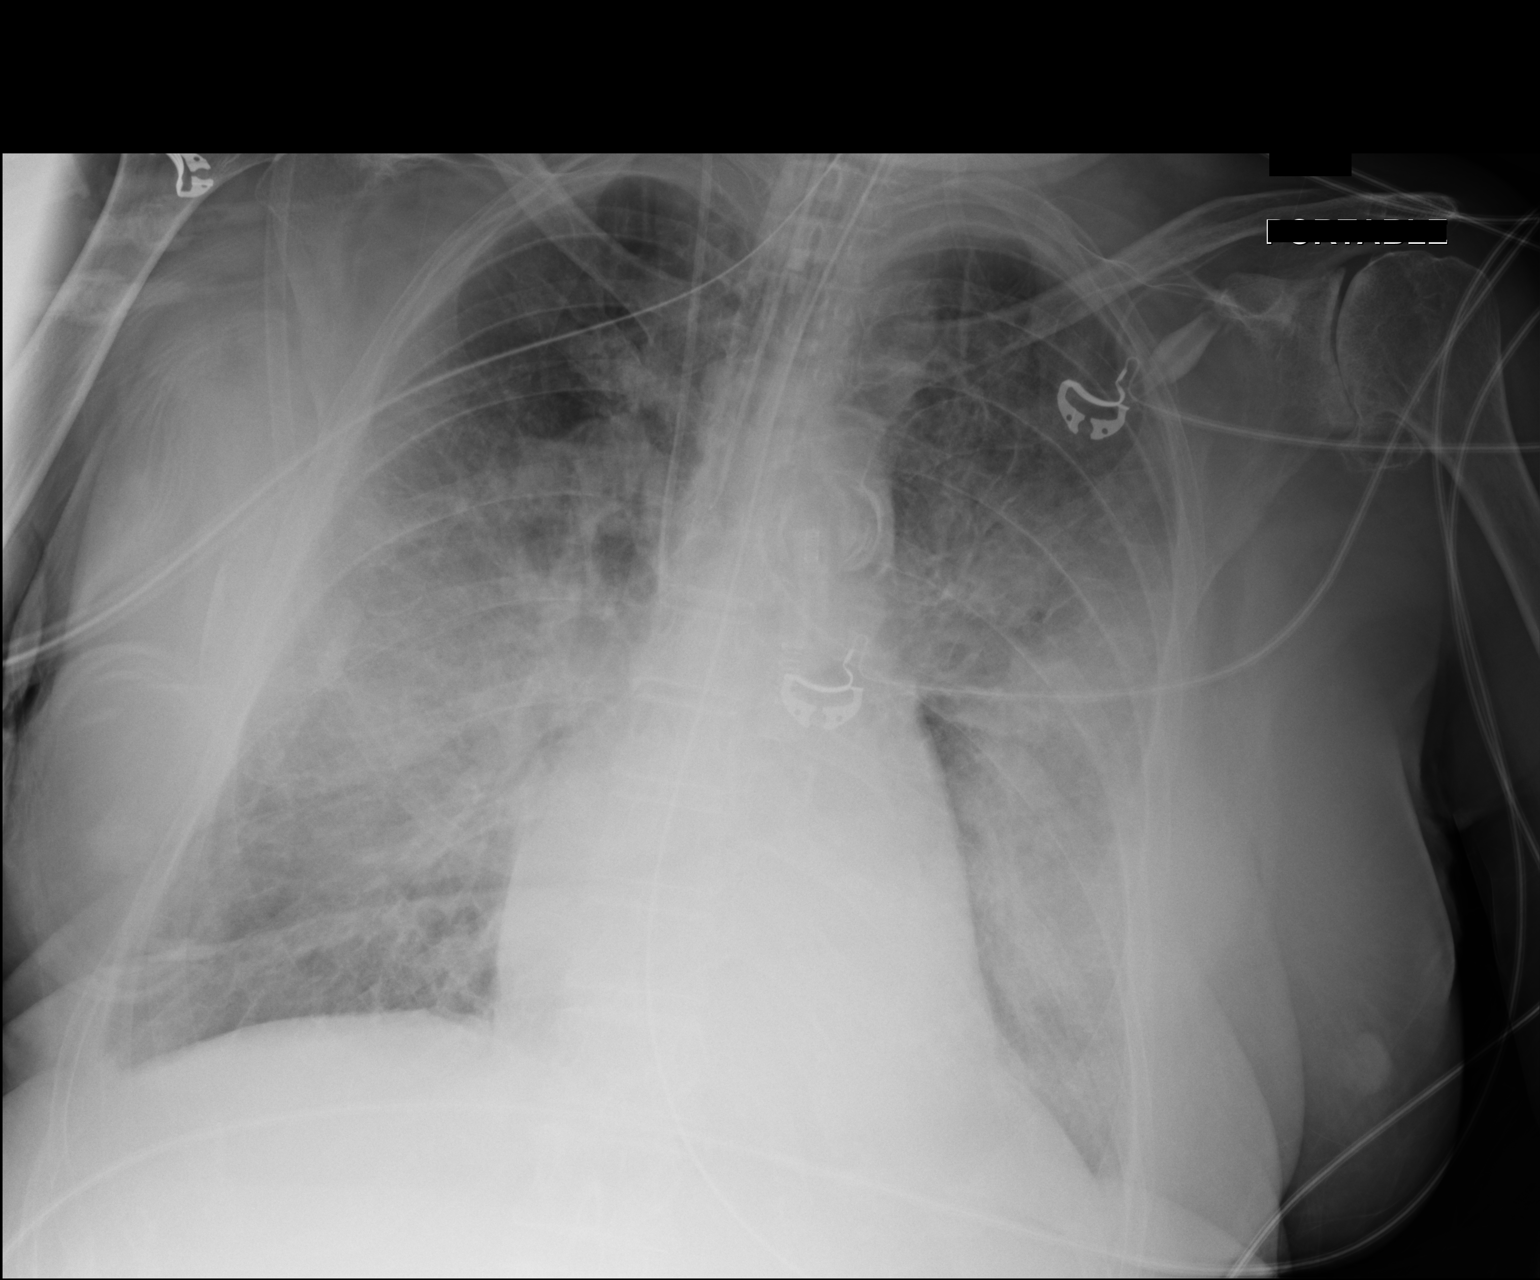

[1 of 1 positions shown; findings below may reference images not displayed]

FINDINGS: Endotracheal tube remains 1.5-2 cm above the carina. Right jugular
central venous catheter has been placed and terminates over the
lower SVC. Enteric tube has been placed and courses into the left
upper abdomen with tip not imaged. Cardiomediastinal silhouette is
unchanged. Thoracic aortic calcification is noted. Bilateral lung
opacities have not significantly changed and are greatest in the
perihilar regions. There is a small right pleural effusion. No
pneumothorax is identified.
IMPRESSION: 1. Support devices as above.
2. Unchanged airspace opacities likely reflecting pulmonary edema.
Small right pleural effusion.

## 2017-06-05 IMAGING — CR DG CHEST 1V PORT
1 series · 1 of 1 positions shown · non-contrast
Comparison: 09/05/2015.

CLINICAL DATA: Respiratory distress.

EXAM:
PORTABLE CHEST 1 VIEW

[AP]
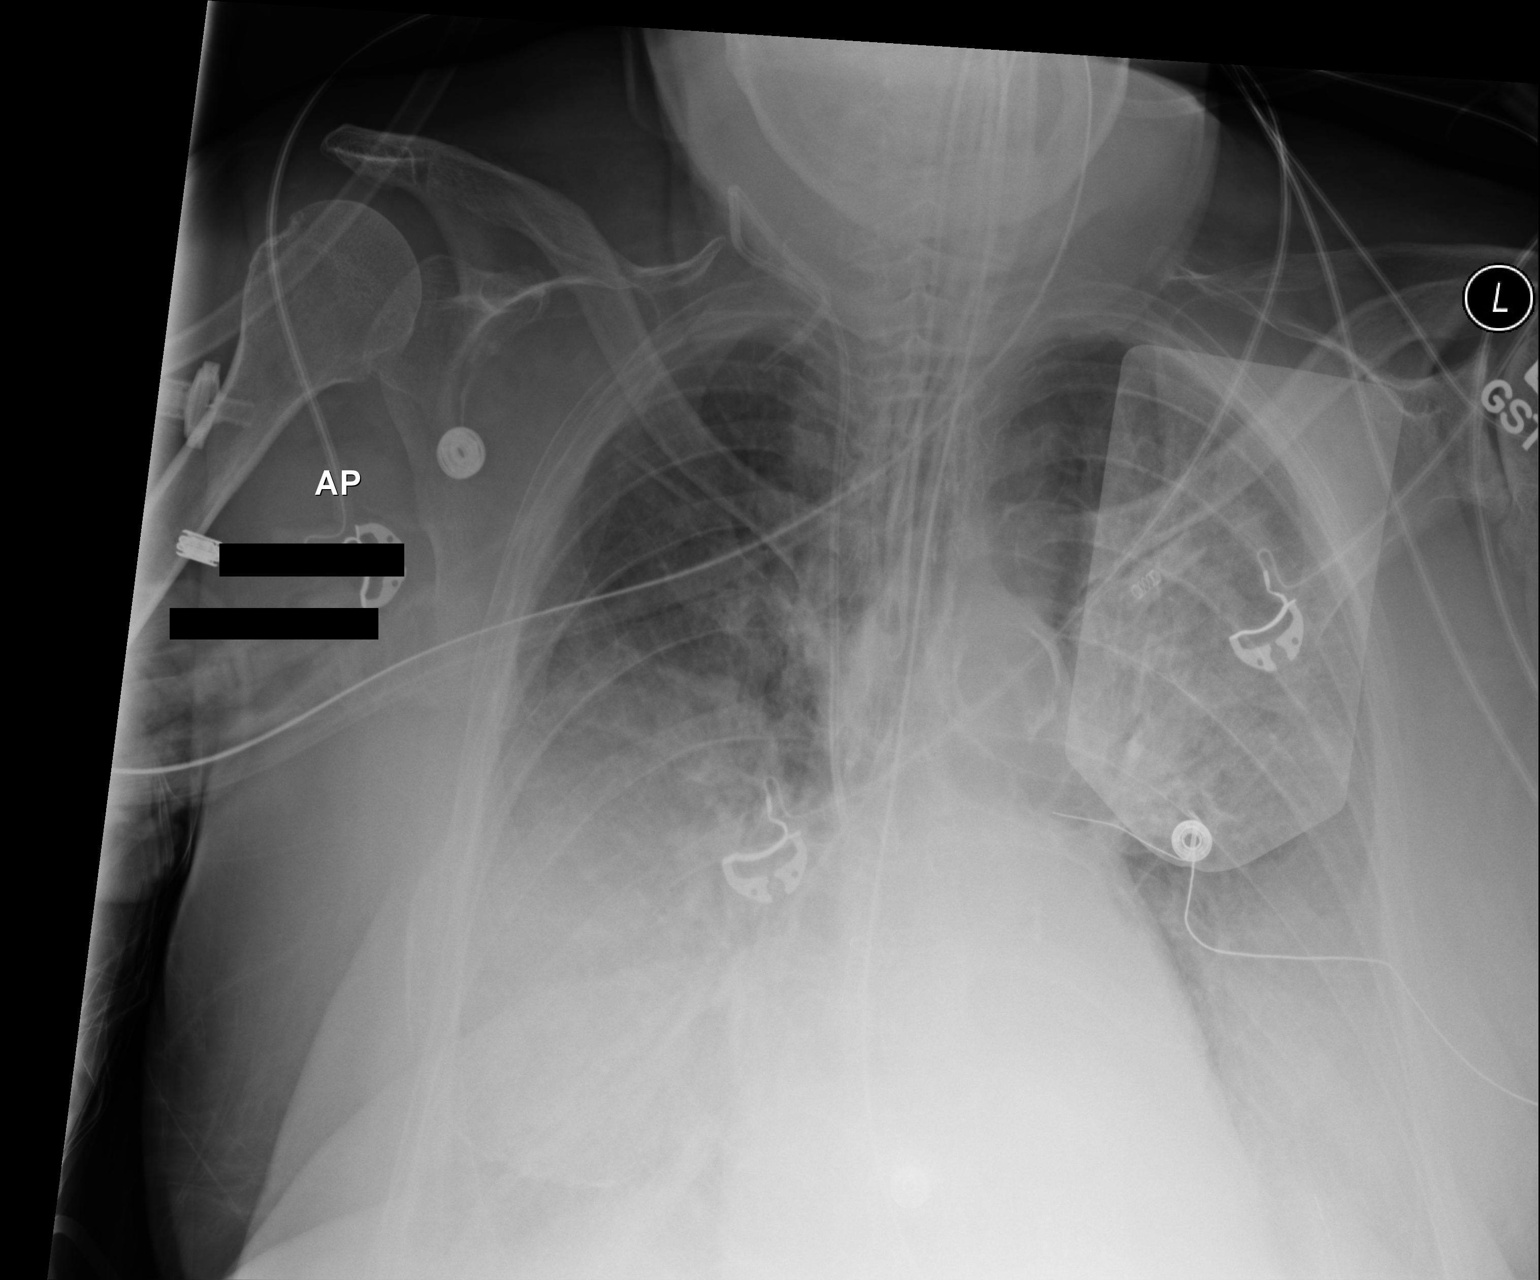

[1 of 1 positions shown; findings below may reference images not displayed]

FINDINGS: Endotracheal tube, NG tube, right IJ line stable position. Heart
size stable. Worsening of diffuse dense bilateral pulmonary
infiltrates. Bilateral pleural effusions noted on today's exam. No
pneumothorax.
IMPRESSION: 1. Lines and tubes in stable position.
2. Worsening of dense bilateral pulmonary infiltrates/ pulmonary
edema. New onset bilateral pleural effusions.

## 2017-06-07 IMAGING — CR DG CHEST 1V PORT
1 series · 1 of 1 positions shown · non-contrast
Comparison: 09/07/2015.

CLINICAL DATA: Respiratory failure.

EXAM:
PORTABLE CHEST 1 VIEW

[AP]
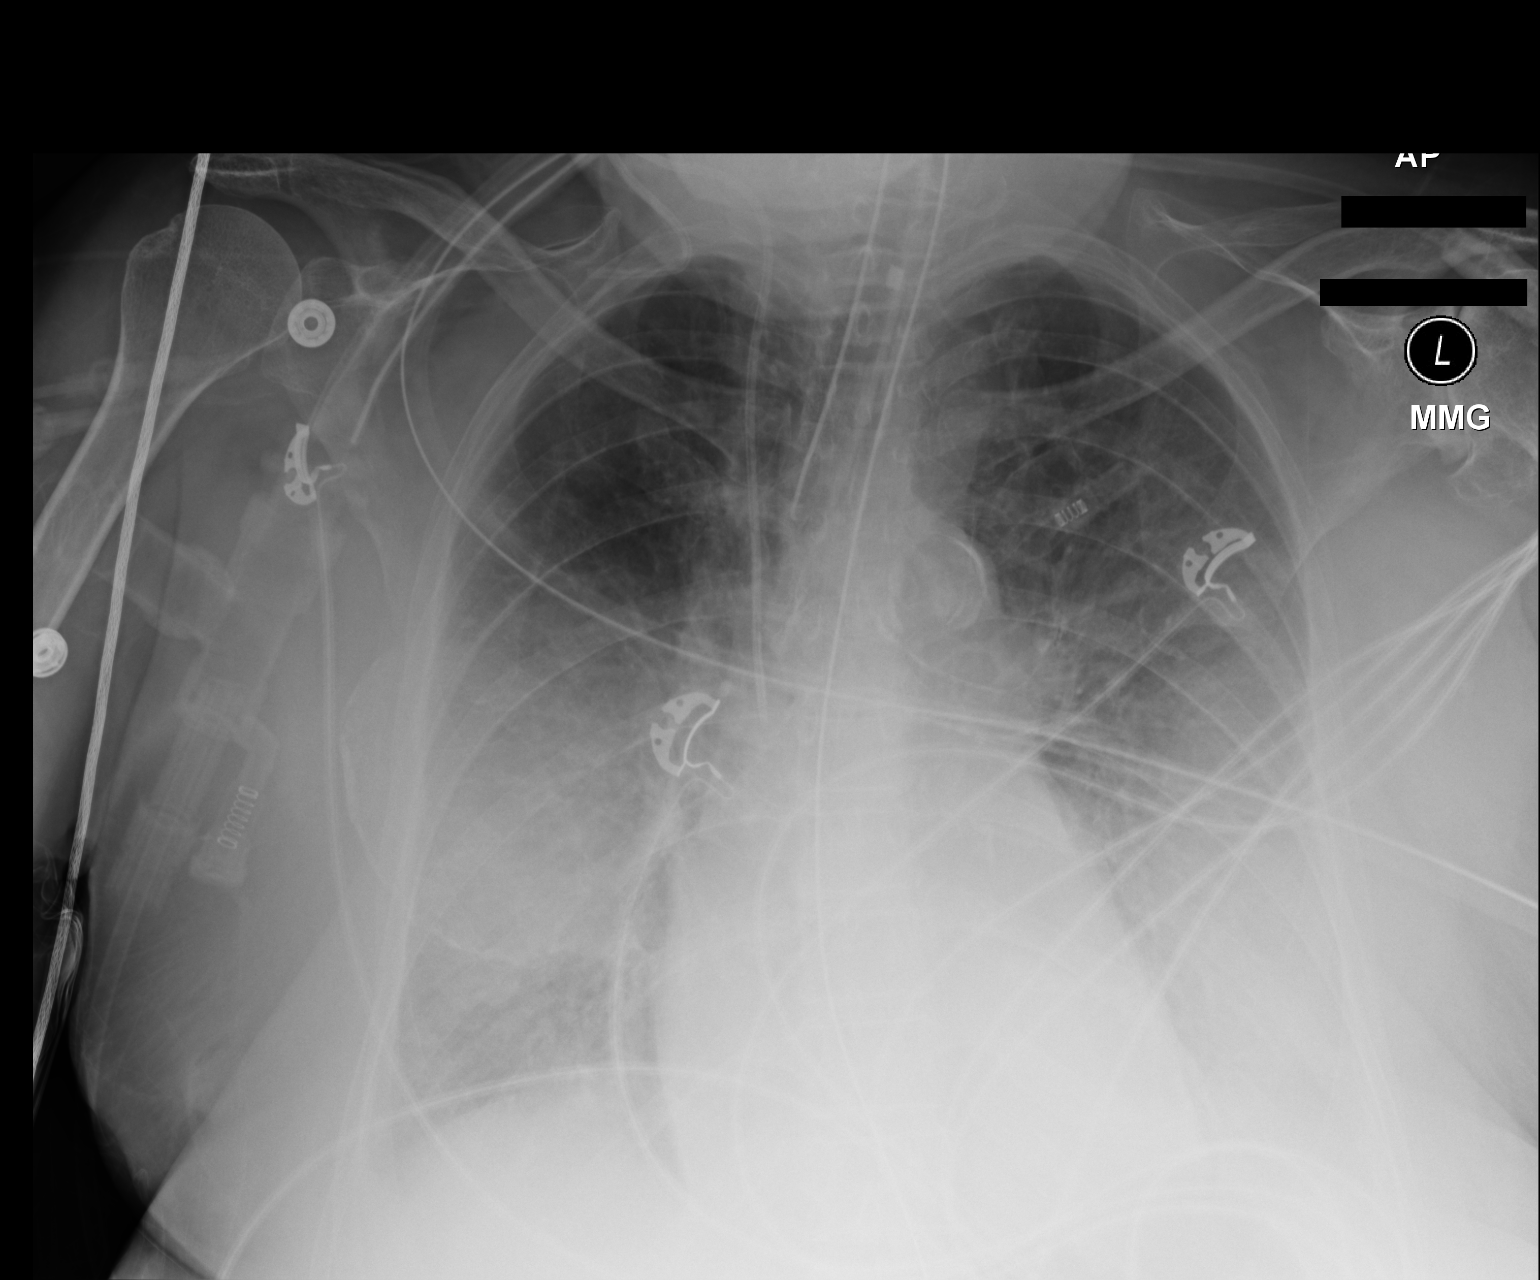

[1 of 1 positions shown; findings below may reference images not displayed]

FINDINGS: Endotracheal tube, NG tube, right IJ line in stable position. Heart
size stable. Diffuse bilateral pulmonary infiltrate and or edema
again noted. Slight and interim progression. No pleural effusion or
pneumothorax.
IMPRESSION: 1. Lines and tubes in stable position.
2. Progressive diffuse bilateral pulmonary infiltrates and/or edema.
Small bilateral pleural effusions.

## 2017-06-08 IMAGING — CR DG CHEST 1V PORT
1 series · 1 of 1 positions shown · non-contrast
Comparison: 09/08/2015, 09/07/2015, 09/06/2015

CLINICAL DATA: 88-year-old female with a history of respiratory
failure. Atrial fibrillation.

EXAM:
PORTABLE CHEST 1 VIEW

[AP]
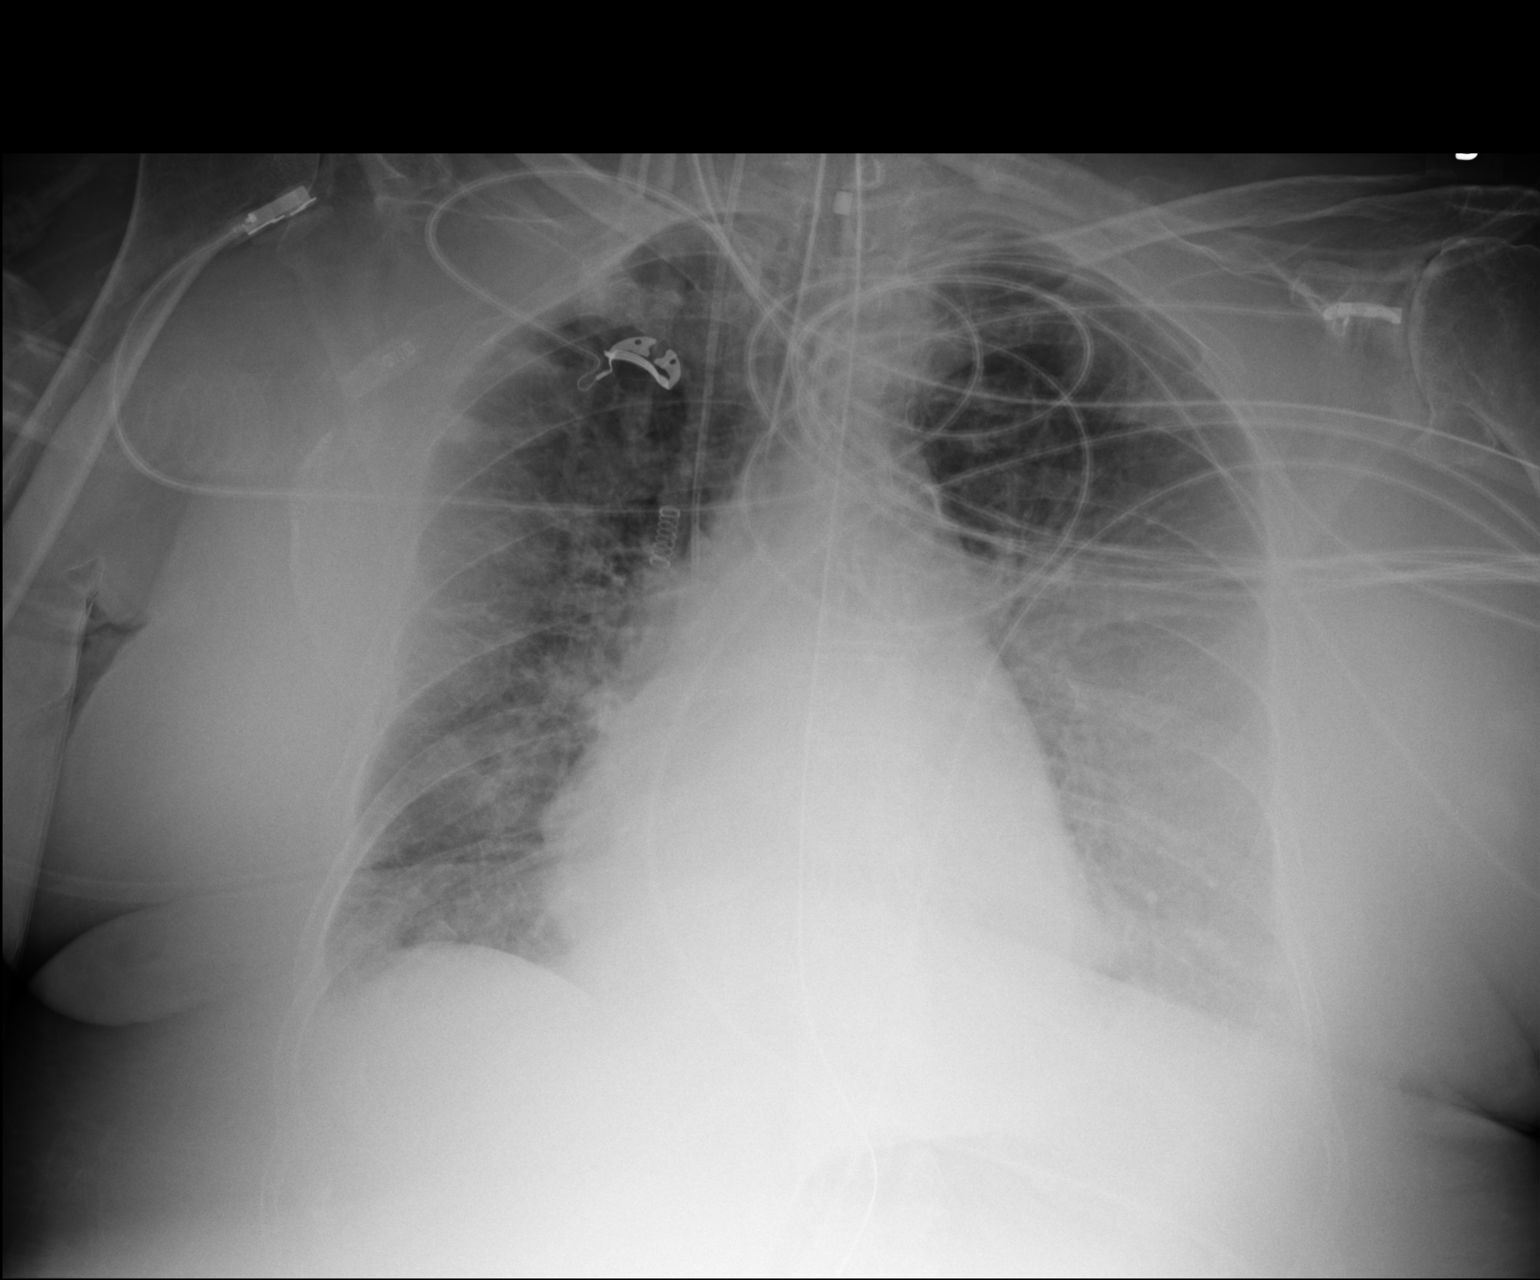

[1 of 1 positions shown; findings below may reference images not displayed]

FINDINGS: Cardiomediastinal silhouette unchanged in size and contour, with
atherosclerotic calcifications of the aortic arch.

Compare to prior chest x-ray there is improved airspace opacity in
the right mid lung. Persisting left sided interstitial airspace
opacity. No visualized pneumothorax.

Endotracheal tube terminates approximately 2.7 cm above the carina.

Gastric tube terminates out of the field of view.

Right IJ approach central catheter terminates in the superior vena
cava.
IMPRESSION: Slight improvement in a right-sided aeration, with mixed bilateral
interstitial and airspace opacities, potentially a combination of
edema, atelectasis, and/ or consolidation.

Unchanged support apparatus, with suitable positioning of
endotracheal tube, and unchanged gastric tube and right IJ central
line.

## 2017-06-08 IMAGING — CR DG CHEST 1V PORT
1 series · 1 of 1 positions shown · non-contrast
Comparison: September 08, 2015.

CLINICAL DATA: Acute respiratory failure.

EXAM:
PORTABLE CHEST 1 VIEW

[AP]
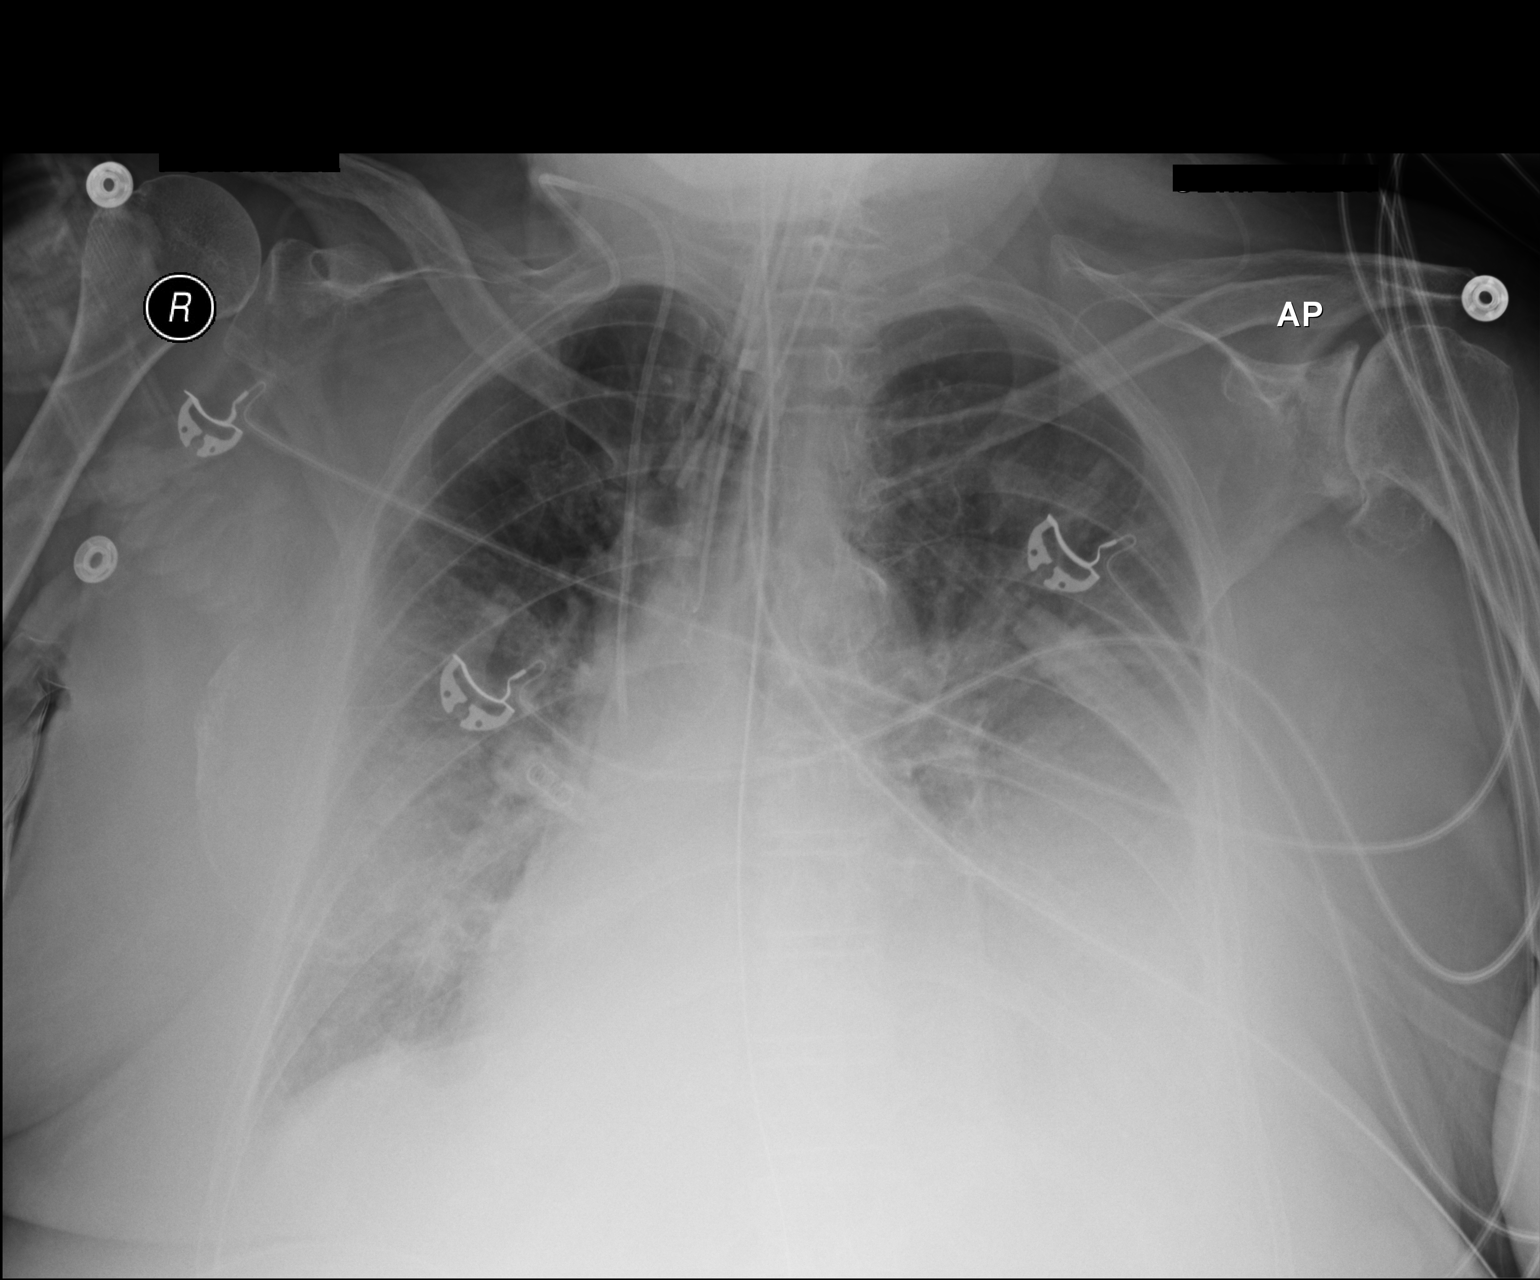

[1 of 1 positions shown; findings below may reference images not displayed]

FINDINGS: Stable cardiomegaly. Endotracheal and nasogastric tubes are
unchanged in position. Right internal jugular catheter is unchanged.
No pneumothorax is noted. Stable bilateral perihilar and basilar
opacities are noted concerning for pneumonia or atelectasis with
associated pleural effusions. Degenerative joint disease of left
glenohumeral joint is noted.
IMPRESSION: Stable support apparatus. Stable bilateral lung opacities as
described above.
